# Patient Record
Sex: Male | Born: 1985 | Race: Black or African American | Hispanic: No | Marital: Single | Smoking: Former smoker
Health system: Southern US, Community
[De-identification: ages and names within clinical notes are randomized; demographics above are authoritative.]

## PROBLEM LIST (undated history)

## (undated) DIAGNOSIS — W3400XA Accidental discharge from unspecified firearms or gun, initial encounter: Secondary | ICD-10-CM

## (undated) DIAGNOSIS — K4090 Unilateral inguinal hernia, without obstruction or gangrene, not specified as recurrent: Secondary | ICD-10-CM

## (undated) HISTORY — PX: ABDOMINAL SURGERY: SHX537

---
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
## 2007-08-06 ENCOUNTER — Emergency Department (HOSPITAL_COMMUNITY): Admission: EM | Admit: 2007-08-06 | Discharge: 2007-08-07 | Payer: Self-pay | Admitting: General Surgery

## 2007-10-07 ENCOUNTER — Emergency Department (HOSPITAL_COMMUNITY): Admission: EM | Admit: 2007-10-07 | Discharge: 2007-10-07 | Payer: Self-pay

## 2007-10-10 ENCOUNTER — Emergency Department (HOSPITAL_COMMUNITY): Admission: EM | Admit: 2007-10-10 | Discharge: 2007-10-10 | Payer: Self-pay | Admitting: Emergency Medicine

## 2008-01-14 ENCOUNTER — Emergency Department (HOSPITAL_COMMUNITY): Admission: EM | Admit: 2008-01-14 | Discharge: 2008-01-14 | Payer: Self-pay | Admitting: Family Medicine

## 2008-05-09 ENCOUNTER — Emergency Department (HOSPITAL_COMMUNITY): Admission: EM | Admit: 2008-05-09 | Discharge: 2008-05-09 | Payer: Self-pay | Admitting: Family Medicine

## 2008-06-29 ENCOUNTER — Inpatient Hospital Stay (HOSPITAL_COMMUNITY): Admission: EM | Admit: 2008-06-29 | Discharge: 2008-06-29 | Payer: Self-pay

## 2008-07-31 ENCOUNTER — Emergency Department (HOSPITAL_COMMUNITY): Admission: EM | Admit: 2008-07-31 | Discharge: 2008-07-31 | Payer: Self-pay | Admitting: Emergency Medicine

## 2008-12-18 ENCOUNTER — Emergency Department (HOSPITAL_COMMUNITY): Admission: EM | Admit: 2008-12-18 | Discharge: 2008-12-18 | Payer: Self-pay | Admitting: Emergency Medicine

## 2010-10-05 ENCOUNTER — Inpatient Hospital Stay (INDEPENDENT_AMBULATORY_CARE_PROVIDER_SITE_OTHER)
Admission: RE | Admit: 2010-10-05 | Discharge: 2010-10-05 | Disposition: A | Discharge status: Home / Self Care | Payer: Self-pay | Source: Ambulatory Visit | Attending: Family Medicine | Admitting: Family Medicine

## 2010-10-05 DIAGNOSIS — K4090 Unilateral inguinal hernia, without obstruction or gangrene, not specified as recurrent: Secondary | ICD-10-CM

## 2010-10-05 DIAGNOSIS — K047 Periapical abscess without sinus: Secondary | ICD-10-CM

## 2010-11-10 LAB — GC/CHLAMYDIA PROBE AMP, GENITAL
Chlamydia, DNA Probe: NEGATIVE
GC Probe Amp, Genital: NEGATIVE
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
## 2011-04-26 LAB — DIFFERENTIAL
Basophils Absolute: 0
Basophils Relative: 0
Eosinophils Absolute: 0
Eosinophils Relative: 1
Lymphocytes Relative: 23
Lymphs Abs: 1.4
Monocytes Absolute: 0.3
Monocytes Relative: 5
Neutro Abs: 4.5
Neutrophils Relative %: 71

## 2011-04-26 LAB — CBC
HCT: 45.5
Hemoglobin: 15.7
MCHC: 34.6
MCV: 93
Platelets: 239
RBC: 4.9
RDW: 12.5
WBC: 6.3

## 2011-04-26 LAB — I-STAT 8, (EC8 V) (CONVERTED LAB)
Acid-base deficit: 5 — ABNORMAL HIGH
BUN: 10
Bicarbonate: 18.5 — ABNORMAL LOW
Chloride: 108
Glucose, Bld: 93
HCT: 48
Hemoglobin: 16.3
Operator id: 133351
Potassium: 4.1
Sodium: 140
TCO2: 19
pCO2, Ven: 31.3 — ABNORMAL LOW
pH, Ven: 7.379 — ABNORMAL HIGH

## 2011-04-26 LAB — POCT I-STAT CREATININE
Creatinine, Ser: 1.5
Operator id: 133351

## 2011-04-26 LAB — SAMPLE TO BLOOD BANK

## 2011-04-26 LAB — PROTIME-INR
INR: 1
Prothrombin Time: 13.6

## 2011-04-26 LAB — APTT: aPTT: 25

## 2011-04-26 LAB — ETHANOL: Alcohol, Ethyl (B): 5

## 2011-05-03 LAB — GC/CHLAMYDIA PROBE AMP, GENITAL
Chlamydia, DNA Probe: NEGATIVE
GC Probe Amp, Genital: POSITIVE — AB

## 2011-05-04 LAB — BASIC METABOLIC PANEL
BUN: 11
CO2: 17 — ABNORMAL LOW
Calcium: 9.4
Chloride: 110
Creatinine, Ser: 1.3
GFR calc Af Amer: >60
GFR calc non Af Amer: >60
Glucose, Bld: 103 — ABNORMAL HIGH
Potassium: 3.1 — ABNORMAL LOW
Sodium: 143

## 2011-05-04 LAB — CBC
HCT: 48.3
Hemoglobin: 16.3
MCHC: 33.8
MCV: 94.9
Platelets: 237
RBC: 5.09
RDW: 13.2
WBC: 5.3

## 2011-05-04 LAB — POCT I-STAT, CHEM 8
BUN: 11
Calcium, Ion: 1.06 — ABNORMAL LOW
Creatinine, Ser: 1.4
Sodium: 145
TCO2: 16

## 2011-05-04 LAB — PROTIME-INR
INR: 1
Prothrombin Time: 13.4

## 2011-05-04 LAB — TYPE AND SCREEN
ABO/RH(D): A POS
Antibody Screen: NEGATIVE

## 2012-09-01 ENCOUNTER — Emergency Department (HOSPITAL_COMMUNITY)
Admission: EM | Admit: 2012-09-01 | Discharge: 2012-09-01 | Disposition: A | Discharge status: Home / Self Care | Payer: Self-pay | Attending: Emergency Medicine | Admitting: Emergency Medicine

## 2012-09-01 ENCOUNTER — Encounter (HOSPITAL_COMMUNITY): Payer: Self-pay | Admitting: Cardiology

## 2012-09-01 DIAGNOSIS — K649 Unspecified hemorrhoids: Secondary | ICD-10-CM | POA: Insufficient documentation

## 2012-09-01 DIAGNOSIS — R1011 Right upper quadrant pain: Secondary | ICD-10-CM | POA: Insufficient documentation

## 2012-09-01 DIAGNOSIS — K625 Hemorrhage of anus and rectum: Secondary | ICD-10-CM | POA: Insufficient documentation

## 2012-09-01 LAB — CBC
HCT: 42.9 % (ref 39.0–52.0)
MCHC: 35.2 g/dL (ref 30.0–36.0)
RBC: 4.71 MIL/uL (ref 4.22–5.81)
RDW: 12.5 % (ref 11.5–15.5)

## 2012-09-01 LAB — BASIC METABOLIC PANEL
BUN: 9 mg/dL (ref 6–23)
Chloride: 100 mEq/L (ref 96–112)
GFR calc Af Amer: >90 mL/min (ref >90)
GFR calc non Af Amer: >90 mL/min (ref >90)
Glucose, Bld: 93 mg/dL (ref 70–99)
Potassium: 3.8 mEq/L (ref 3.5–5.1)
Sodium: 136 mEq/L (ref 135–145)

## 2012-09-01 LAB — HEPATIC FUNCTION PANEL
ALT: 75 U/L — ABNORMAL HIGH (ref 0–53)
AST: 29 U/L (ref 0–37)
Albumin: 4.2 g/dL (ref 3.5–5.2)
Bilirubin, Direct: <0.1 mg/dL (ref 0.0–0.3)

## 2012-09-01 MED ORDER — PE-SHARK LIVER OIL-COCOA BUTTR 0.25-3-85.5 % RE SUPP: 1.0000 | RECTAL | Status: DC | PRN

## 2012-09-01 MED ORDER — ONDANSETRON HCL 4 MG/2ML IJ SOLN
4.0000 mg | Freq: Once | INTRAMUSCULAR | Status: DC
  Filled 2012-09-01: qty 2

## 2012-09-01 MED ORDER — MORPHINE SULFATE 4 MG/ML IJ SOLN
4.0000 mg | Freq: Once | INTRAMUSCULAR | Status: DC
  Filled 2012-09-01: qty 1

## 2012-09-01 NOTE — ED Provider Notes (Signed)
History     CSN: [id number]  Arrival date & time 09/01/12  1704   First MD Initiated Contact with Patient 09/01/12 1837      Chief Complaint  Patient presents with  . Abdominal Pain  . Rectal Bleeding    (Consider location/radiation/quality/duration/timing/severity/associated sxs/prior treatment) HPI Pt presenting with c/o pain with defecation and rectal bleeding.  He states he had similar symptoms approx 3 months ago which resolved and then has recurred over the past several weeks.  He does have some lower abdominal cramping as well.  No fever/chills, no vomiting.  No diarrhea.  He does have blood on toilet tissue after passing stools.  No blood mixed in with stool.  There are no other associated systemic symptoms, there are no other alleviating or modifying factors.   History reviewed. No pertinent past medical history.  History reviewed. No pertinent past surgical history.  History reviewed. No pertinent family history.  History  Substance Use Topics  . Smoking status: Not on file  . Smokeless tobacco: Not on file  . Alcohol Use: Yes      Review of Systems ROS reviewed and all otherwise negative except for mentioned in HPI  Allergies  Shellfish allergy  Home Medications   Current Outpatient Rx  Name  Route  Sig  Dispense  Refill  . NAPROXEN SODIUM 220 MG PO TABS   Oral   Take 440 mg by mouth 2 (two) times daily as needed. For pain         . PE-SHARK LIVER OIL-COCOA BUTTR 0.25-3-85.5 % RE SUPP   Rectal   Place 1 suppository rectally as needed for hemorrhoids.   12 suppository   0     BP 105/73  Pulse 52  Temp 98.3 F (36.8 C) (Oral)  Resp 18  SpO2 100% Vitals reviewed Physical Exam Physical Examination: General appearance - alert, well appearing, and in no distress Mental status - alert, oriented to person, place, and time Eyes - pupils equal and reactive, extraocular eye movements intact Mouth - mucous membranes moist, pharynx normal without  lesions Chest - clear to auscultation, no wheezes, rales or rhonchi, symmetric air entry Heart - normal rate, regular rhythm, normal S1, S2, no murmurs, rubs, clicks or gallops Abdomen - soft, nontender, nondistended, no masses or organomegaly Rectal - no fissure, small pea sized hemorrhoid at superior margin of rectum- tender to palpation- does not appear thrombosed, otherwise no mass, no perirectal abscess Extremities - peripheral pulses normal, no pedal edema, no clubbing or cyanosis Skin - normal coloration and turgor, no rashes  ED Course  Procedures (including critical care time)  Labs Reviewed  HEPATIC FUNCTION PANEL - Abnormal; Notable for the following:    ALT 75 (*)     All other components within normal limits  CBC  BASIC METABOLIC PANEL  LAB REPORT - SCANNED  OCCULT BLOOD X 1 CARD TO LAB, STOOL   No results found.   1. Rectal bleeding   2. Hemorrhoid       MDM  Pt pesenting with rectal bleeding, abdominal exam is nontender and benign.  Small hemorrhoid on rectal exam with hemocult positive- no gross blood on DRE.  Labs reassuring without elevation in WBC or anemia.  Pt given rx for preparation H, advised if symptoms continue to contact GI for followup.  Discharged with strict return precautions.  Pt agreeable with plan.        Ethelda Chick, MD 09/02/12 [phone]

## 2012-09-01 NOTE — ED Notes (Signed)
Pt reports back in October he had some rectal bleeding but was never evaluated for symptoms. States recently the bleeding has returned and is now having abd cramping. Reports blood is dark red. States he eats a lot of hot sauce and thinks that may have made it worse.

## 2012-09-01 NOTE — Discharge Instructions (Signed)
Return to the ED with any concerns including vomiting and not able to keep down liquids, fever/chills, worsening abdominal pain- especially if it localizes to the right lower abdomen, decreased level of alertness/lethargy, or any other alarming symptoms °

## 2012-09-01 NOTE — ED Notes (Addendum)
Pt has hx of hernia to left side of abdomen, pt denies having abdominal surgery in the past. Hernia is usually not painful and now he presents with left sided tenderness and right upper quadrant tenderness. Pt has difficulty standing and laying certain ways because of abdominal cramping. Pt states he has had blood in stool "on and off" since October 2012 but it has increased in the past 2 weeks. Pt reports dark red clots in stool. Denies emesis.

## 2013-02-26 ENCOUNTER — Emergency Department (HOSPITAL_COMMUNITY)
Admission: EM | Admit: 2013-02-26 | Discharge: 2013-02-26 | Disposition: A | Discharge status: Home / Self Care | Payer: Self-pay | Attending: General Surgery | Admitting: General Surgery

## 2013-02-26 ENCOUNTER — Encounter (HOSPITAL_COMMUNITY): Payer: Self-pay | Admitting: Cardiology

## 2013-02-26 ENCOUNTER — Emergency Department (HOSPITAL_COMMUNITY): Payer: Self-pay

## 2013-02-26 DIAGNOSIS — Z79899 Other long term (current) drug therapy: Secondary | ICD-10-CM | POA: Insufficient documentation

## 2013-02-26 DIAGNOSIS — K4090 Unilateral inguinal hernia, without obstruction or gangrene, not specified as recurrent: Secondary | ICD-10-CM

## 2013-02-26 DIAGNOSIS — Z87828 Personal history of other (healed) physical injury and trauma: Secondary | ICD-10-CM

## 2013-02-26 LAB — URINALYSIS, ROUTINE W REFLEX MICROSCOPIC
Bilirubin Urine: NEGATIVE
Leukocytes, UA: NEGATIVE
Nitrite: NEGATIVE
Specific Gravity, Urine: 1.017 (ref 1.005–1.030)
Urobilinogen, UA: 0.2 mg/dL (ref 0.0–1.0)

## 2013-02-26 LAB — CBC WITH DIFFERENTIAL/PLATELET
Basophils Relative: 0 % (ref 0–1)
Eosinophils Absolute: 0.1 10*3/uL (ref 0.0–0.7)
Eosinophils Relative: 1 % (ref 0–5)
HCT: 42.4 % (ref 39.0–52.0)
Hemoglobin: 15.3 g/dL (ref 13.0–17.0)
Lymphs Abs: 2 10*3/uL (ref 0.7–4.0)
MCH: 32.2 pg (ref 26.0–34.0)
MCHC: 36.1 g/dL — ABNORMAL HIGH (ref 30.0–36.0)
MCV: 89.3 fL (ref 78.0–100.0)
Monocytes Absolute: 0.8 10*3/uL (ref 0.1–1.0)
Monocytes Relative: 12 % (ref 3–12)
RBC: 4.75 MIL/uL (ref 4.22–5.81)

## 2013-02-26 LAB — COMPREHENSIVE METABOLIC PANEL
Albumin: 4.2 g/dL (ref 3.5–5.2)
Alkaline Phosphatase: 107 U/L (ref 39–117)
BUN: 9 mg/dL (ref 6–23)
Calcium: 9.6 mg/dL (ref 8.4–10.5)
Creatinine, Ser: 0.95 mg/dL (ref 0.50–1.35)
GFR calc Af Amer: >90 mL/min (ref >90)
Glucose, Bld: 108 mg/dL — ABNORMAL HIGH (ref 70–99)
Potassium: 3.7 mEq/L (ref 3.5–5.1)
Total Protein: 7.2 g/dL (ref 6.0–8.3)

## 2013-02-26 LAB — LIPASE, BLOOD: Lipase: 41 U/L (ref 11–59)

## 2013-02-26 LAB — URINE MICROSCOPIC-ADD ON

## 2013-02-26 MED ORDER — ONDANSETRON HCL 4 MG/2ML IJ SOLN
4.0000 mg | Freq: Once | INTRAMUSCULAR | Status: AC
  Administered 2013-02-26: 4 mg via INTRAVENOUS
  Filled 2013-02-26: qty 2

## 2013-02-26 MED ORDER — KETOROLAC TROMETHAMINE 30 MG/ML IJ SOLN
30.0000 mg | Freq: Once | INTRAMUSCULAR | Status: AC
  Administered 2013-02-26: 30 mg via INTRAVENOUS
  Filled 2013-02-26: qty 1

## 2013-02-26 MED ORDER — TRAMADOL HCL 50 MG PO TABS: 50.0000 mg | ORAL_TABLET | Freq: Four times a day (QID) | ORAL | Status: DC | PRN

## 2013-02-26 MED ORDER — HYDROMORPHONE HCL PF 1 MG/ML IJ SOLN
0.5000 mg | Freq: Once | INTRAMUSCULAR | Status: AC
  Administered 2013-02-26: 0.5 mg via INTRAVENOUS
  Filled 2013-02-26: qty 1

## 2013-02-26 MED ORDER — IOHEXOL 300 MG/ML  SOLN
25.0000 mL | INTRAMUSCULAR | Status: AC
  Administered 2013-02-26 (×2): 25 mL via ORAL

## 2013-02-26 MED ORDER — SODIUM CHLORIDE 0.9 % IV SOLN
1000.0000 mL | Freq: Once | INTRAVENOUS | Status: AC
  Administered 2013-02-26: 1000 mL via INTRAVENOUS
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
## 2013-02-26 NOTE — ED Notes (Signed)
Pt reports a left sided hernia that he was dx with about 13 years. States that he has been seen for this before and given referrals but unable to afford the surgery right now. Denies any changes in bowel movements. Denies any vomiting but does report nausea.

## 2013-02-26 NOTE — ED Notes (Signed)
Patient transported to CT 

## 2013-02-26 NOTE — ED Provider Notes (Signed)
CSN: [id number]     Arrival date & time 02/26/13  1005 History     First MD Initiated Contact with Patient 02/26/13 1113     Chief Complaint  Patient presents with  . Hernia   (Consider location/radiation/quality/duration/timing/severity/associated sxs/prior Treatment) HPI Patient presents with concerns of increasing pain in his left lower abdomen and scrotum. He has previously been diagnosed with hernia, but has no prior surgical repair. He states that in particular over the past few days pain has become severe, incapacitating. The pain is sore, worse with motion.  Pain is minimally improved with scrotal elevation. No concurrent fever, chills.  She does have nausea with diarrhea, but no vomiting. Patient is otherwise well and has no other medical problems.  History reviewed. No pertinent past medical history. History reviewed. No pertinent past surgical history. History reviewed. No pertinent family history. History  Substance Use Topics  . Smoking status: Not on file  . Smokeless tobacco: Not on file  . Alcohol Use: No    Review of Systems  Constitutional:       Per HPI, otherwise negative  HENT:       Per HPI, otherwise negative  Respiratory:       Per HPI, otherwise negative  Cardiovascular:       Per HPI, otherwise negative  Gastrointestinal: Positive for nausea and diarrhea. Negative for vomiting.  Endocrine:       Negative aside from HPI  Genitourinary:       Neg aside from HPI   Musculoskeletal:       Per HPI, otherwise negative  Skin: Negative.   Neurological: Negative for syncope.    Allergies  Shellfish allergy  Home Medications   Current Outpatient Rx  Name  Route  Sig  Dispense  Refill  . naproxen sodium (ANAPROX) 220 MG tablet   Oral   Take 440 mg by mouth 2 (two) times daily as needed. For pain         . shark liver oil-cocoa butter (PREPARATION H) 0.25-3-85.5 % suppository   Rectal   Place 1 suppository rectally as needed for  hemorrhoids.   12 suppository   0    BP 120/73  Pulse 66  Temp(Src) 98.6 F (37 C) (Oral)  Resp 17  SpO2 100% Physical Exam  Nursing note and vitals reviewed. Constitutional: He is oriented to person, place, and time. He appears well-developed. No distress.  HENT:  Head: Normocephalic and atraumatic.  Eyes: Conjunctivae and EOM are normal.  Cardiovascular: Normal rate and regular rhythm.   Pulmonary/Chest: Effort normal. No stridor. No respiratory distress.  Abdominal: He exhibits no distension. There is tenderness in the left lower quadrant. There is no rigidity, no rebound, no guarding and no CVA tenderness.  Genitourinary:  There is gross edema of the scrotal sac with palpable mass on the left greater than right.  The entire scrotum, and left inguinal canal are exquisitely tender to palpation.   Musculoskeletal: He exhibits no edema.  Neurological: He is alert and oriented to person, place, and time.  Skin: Skin is warm and dry.  Psychiatric: He has a normal mood and affect.    ED Course   Procedures (including critical care time)  Labs Reviewed  CBC WITH DIFFERENTIAL  COMPREHENSIVE METABOLIC PANEL  LIPASE, BLOOD  URINALYSIS, ROUTINE W REFLEX MICROSCOPIC   No results found. No diagnosis found. Pulse ox 99% room air normal  I interpreted the CT findings and discussed results with surgery.  Update:  Patient appears more comfortable. MDM  Patient presents with worsening pain about a left inguinal hernia.  On exam he is awake alert, afebrile.  The patient's description of more severe pain prompted CT evaluation with labs.  There is a large inguinal hernia, but no incarceration of bowel.  With the hernia, discussed his case with surgery who saw and evaluated the patient.  Patient will have outpatient followup.  Gerhard Munch, MD 02/26/13 [phone]

## 2013-02-27 MED ORDER — TRAMADOL HCL 50 MG PO TABS: 50.0000 mg | ORAL_TABLET | Freq: Four times a day (QID) | ORAL | Status: DC | PRN

## 2013-02-27 NOTE — Consult Note (Signed)
Agree Tej Murdaugh, MD, MPH, FACS Pager: [phone]  

## 2014-04-16 ENCOUNTER — Emergency Department (HOSPITAL_COMMUNITY): Payer: Self-pay

## 2014-04-16 ENCOUNTER — Encounter (HOSPITAL_COMMUNITY): Payer: Self-pay | Admitting: Emergency Medicine

## 2014-04-16 ENCOUNTER — Emergency Department (HOSPITAL_COMMUNITY)
Admission: EM | Admit: 2014-04-16 | Discharge: 2014-04-17 | Disposition: A | Discharge status: Home / Self Care | Payer: Self-pay | Attending: Emergency Medicine | Admitting: Emergency Medicine

## 2014-04-16 DIAGNOSIS — R1032 Left lower quadrant pain: Secondary | ICD-10-CM | POA: Insufficient documentation

## 2014-04-16 DIAGNOSIS — F17200 Nicotine dependence, unspecified, uncomplicated: Secondary | ICD-10-CM | POA: Insufficient documentation

## 2014-04-16 DIAGNOSIS — K4090 Unilateral inguinal hernia, without obstruction or gangrene, not specified as recurrent: Secondary | ICD-10-CM | POA: Insufficient documentation

## 2014-04-16 HISTORY — DX: Unilateral inguinal hernia, without obstruction or gangrene, not specified as recurrent: K40.90

## 2014-04-16 LAB — CBC WITH DIFFERENTIAL/PLATELET
BASOS ABS: 0 10*3/uL (ref 0.0–0.1)
BASOS PCT: 0 % (ref 0–1)
EOS PCT: 1 % (ref 0–5)
Eosinophils Absolute: 0 10*3/uL (ref 0.0–0.7)
HCT: 46.3 % (ref 39.0–52.0)
Hemoglobin: 16.5 g/dL (ref 13.0–17.0)
LYMPHS ABS: 2.2 10*3/uL (ref 0.7–4.0)
Lymphocytes Relative: 51 % — ABNORMAL HIGH (ref 12–46)
MCH: 31.5 pg (ref 26.0–34.0)
MCHC: 35.6 g/dL (ref 30.0–36.0)
MCV: 88.5 fL (ref 78.0–100.0)
Monocytes Absolute: 0.3 10*3/uL (ref 0.1–1.0)
Monocytes Relative: 7 % (ref 3–12)
NEUTROS PCT: 41 % — AB (ref 43–77)
Neutro Abs: 1.8 10*3/uL (ref 1.7–7.7)
PLATELETS: 217 10*3/uL (ref 150–400)
RBC: 5.23 MIL/uL (ref 4.22–5.81)
RDW: 12.5 % (ref 11.5–15.5)
WBC: 4.3 10*3/uL (ref 4.0–10.5)

## 2014-04-16 LAB — BASIC METABOLIC PANEL
ANION GAP: 15 (ref 5–15)
BUN: 7 mg/dL (ref 6–23)
CALCIUM: 9.5 mg/dL (ref 8.4–10.5)
CO2: 25 mEq/L (ref 19–32)
Chloride: 104 mEq/L (ref 96–112)
Creatinine, Ser: 1.01 mg/dL (ref 0.50–1.35)
Glucose, Bld: 110 mg/dL — ABNORMAL HIGH (ref 70–99)
POTASSIUM: 4 meq/L (ref 3.7–5.3)
SODIUM: 144 meq/L (ref 137–147)

## 2014-04-16 MED ORDER — IOHEXOL 300 MG/ML  SOLN
25.0000 mL | INTRAMUSCULAR | Status: AC
  Administered 2014-04-16 (×2): 25 mL via ORAL

## 2014-04-16 NOTE — ED Notes (Signed)
Pt c/o increased pain to his hernia, sts he has had it since he was 28 years old. sts he has been seen for this before and given pain meds and told to follow up, pt sts he hasn't followed up with anyone yet. Nad, skin warm and dry, resp e/u.

## 2014-04-16 NOTE — ED Notes (Signed)
Dr. Rancour at bedside. 

## 2014-04-16 NOTE — ED Provider Notes (Signed)
CSN: [id number]     Arrival date & time 04/16/14  1622 History   First MD Initiated Contact with Patient 04/16/14 2018     Chief Complaint  Patient presents with  . Inguinal Hernia     (Consider location/radiation/quality/duration/timing/severity/associated sxs/prior Treatment) The history is provided by the patient and medical records. No language interpreter was used.    Vincent Baldwin is a 28 y.o. male  with a hx of hernia presents to the Emergency Department complaining of gradual, persistent, progressively worsening pain in the left scrotum and at the site of the hernia onset 3 days ago after lifting heavy boxes at work.  Patient reports he developed a hernia approximately age 45 but has not followed up with primary care physician or general surgery for this. He reports that 3 years ago it grew to approximately the size that it is now, but he feels like it is some bigger since Sunday. He reports is looking uncomfortable but the pain has worsened in the last several days since he lifted a box. Patient reports taking Vicodin at home which alleviates the pain but pain returns when the medication wears off. Patient reports that he is unable to reduce the hernia at baseline. No aggravating or alleviating factors. Patient denies fever, chills, nausea, vomiting. No difficulty with bowel movements.  Past Medical History  Diagnosis Date  . Inguinal hernia    History reviewed. No pertinent past surgical history. No family history on file. History  Substance Use Topics  . Smoking status: Current Every Day Smoker -- 0.25 packs/day  . Smokeless tobacco: Not on file  . Alcohol Use: Yes     Comment: socially    Review of Systems  Constitutional: Negative for fever, diaphoresis, appetite change, fatigue and unexpected weight change.  HENT: Negative for mouth sores.   Eyes: Negative for visual disturbance.  Respiratory: Negative for cough, chest tightness, shortness of breath and wheezing.    Cardiovascular: Negative for chest pain.  Gastrointestinal: Positive for abdominal pain. Negative for nausea, vomiting, diarrhea and constipation.  Endocrine: Negative for polydipsia, polyphagia and polyuria.  Genitourinary: Positive for scrotal swelling. Negative for dysuria, urgency, frequency and hematuria.       Inguinal hernia  Musculoskeletal: Negative for back pain and neck stiffness.  Skin: Negative for rash.  Allergic/Immunologic: Negative for immunocompromised state.  Neurological: Negative for syncope, light-headedness and headaches.  Hematological: Does not bruise/bleed easily.  Psychiatric/Behavioral: Negative for sleep disturbance. The patient is not nervous/anxious.       Allergies  Shellfish allergy  Home Medications   Prior to Admission medications   Medication Sig Start Date End Date Taking? Authorizing Provider  acetaminophen (TYLENOL) 325 MG tablet Take 650 mg by mouth every 6 (six) hours as needed for mild pain.   Yes Historical Provider, MD  ketorolac (TORADOL) 10 MG tablet Take 1 tablet (10 mg total) by mouth every 6 (six) hours as needed. 04/17/14   Sanora Cunanan, PA-C   BP 108/76  Pulse 52  Temp(Src) 99.5 F (37.5 C) (Oral)  Resp 16  Ht  (1.702 m)  Wt 194 lb (87.998 kg)  BMI 30.38 kg/m2  SpO2 100% Physical Exam  Nursing note and vitals reviewed. Constitutional: He appears well-developed and well-nourished. No distress.  Awake, alert, nontoxic appearance  HENT:  Head: Normocephalic and atraumatic.  Mouth/Throat: Oropharynx is clear and moist. No oropharyngeal exudate.  Eyes: Conjunctivae are normal. No scleral icterus.  Neck: Normal range of motion. Neck supple.  Cardiovascular:  Normal rate, regular rhythm, normal heart sounds and intact distal pulses.   No murmur heard. Pulmonary/Chest: Effort normal and breath sounds normal. No respiratory distress. He has no wheezes.  Equal chest expansion  Abdominal: Soft. Bowel sounds are normal.  He exhibits no mass. There is tenderness in the left lower quadrant. There is no rebound and no guarding. A hernia is present. Hernia confirmed positive in the left inguinal area. Hernia confirmed negative in the right inguinal area.  Very mild left lower quadrant abdominal pain which creates pain in the inguinal ring with palpation  Genitourinary: Testes normal and penis normal. Right testis shows no mass, no swelling and no tenderness. Right testis is descended. Left testis shows no mass, no swelling and no tenderness. Left testis is descended. Circumcised. No phimosis, paraphimosis, hypospadias, penile erythema or penile tenderness. No discharge found.  Palpation of the left inguinal ring Large hernia present in the scrotum; not reducible No pain to palpation of right or left testicle, no pain to palpation of the penis  Musculoskeletal: Normal range of motion. He exhibits no edema.  Lymphadenopathy:       Right: No inguinal adenopathy present.       Left: No inguinal adenopathy present.  Neurological: He is alert.  Speech is clear and goal oriented Moves extremities without ataxia  Skin: Skin is warm and dry. He is not diaphoretic.  Psychiatric: He has a normal mood and affect.    ED Course  Procedures (including critical care time) Labs Review Labs Reviewed  CBC WITH DIFFERENTIAL - Abnormal; Notable for the following:    Neutrophils Relative % 41 (*)    Lymphocytes Relative 51 (*)    All other components within normal limits  BASIC METABOLIC PANEL - Abnormal; Notable for the following:    Glucose, Bld 110 (*)    All other components within normal limits    Imaging Review Ct Pelvis Wo Contrast  04/17/2014   CLINICAL DATA:  Known hernia since 10 years ago. Pain. Difficult to sit down. Evaluate for bowel strangulation.  EXAM: CT PELVIS WITHOUT CONTRAST  TECHNIQUE: Multidetector CT imaging of the pelvis was performed following the standard protocol without intravenous contrast.   COMPARISON:  Scrotal ultrasound 04/16/2014  FINDINGS: There is a large left inguinal hernia. Herniation of mesenteric fat into the left scrotal sac is noted. However, no herniated bowel loops are identified at the time of the exam. There is no evidence for bowel obstruction. No free pelvic fluid. No pelvic adenopathy. Incidentally noted the appendix has a normal appearance urinary bladder is distended. Prostate gland is normal in appearance.  IMPRESSION: Herniation of a large amount of mesenteric fat into the left scrotal sac. No herniated bowel loops at this time.   Electronically Signed   By: Rosalie Gums M.D.   On: 04/17/2014 00:43   US Scrotum  04/16/2014   CLINICAL DATA:  History of left groin hernia.  CT abdomen pelvis without contrast dated 02/26/2013 describes a large direct inguinal hernia on the left with extension into the left scrotal sac. At that time, the hernia contained only fat. Patient previously evaluated by surgery in July 2014 and recommended for outpatient followup for elective surgery.  EXAM: SCROTAL ULTRASOUND  DOPPLER ULTRASOUND OF THE TESTICLES  TECHNIQUE: Complete ultrasound examination of the testicles, epididymis, and other scrotal structures was performed. Color and spectral Doppler ultrasound were also utilized to evaluate blood flow to the testicles.  COMPARISON:  CT abdomen pelvis 02/26/2013  FINDINGS: Right  testicle  Measurements: 4.2 x 2.4 x 2.7 cm. No mass or microlithiasis visualized.  Left testicle  Measurements: 3.6 x 2.2 x 3.1 cm. No mass or microlithiasis visualized.  Right epididymis:  Normal in size and appearance.  Left epididymis:  Normal in size and appearance.  Hydrocele:  None visualized.  Varicocele:  None visualized.  Additional: In the left inguinal region, prominent fat density is seen lateral to the left testis. This correlates with the large fat-containing inguinal hernia seen on prior CT. On image number 73 the depth of this fat containing hernia measures at  least 6.7 cm. There are some small tubular structures within the hernia that likely reflect mesenteries vessels. No definite bowel signature is seen by ultrasound with in the left inguinal region.  Pulsed Doppler interrogation of both testes demonstrates low resistance arterial and venous waveforms bilaterally.  IMPRESSION: 1. Normal testes bilaterally. 2. Findings consistent with a large left inguinal hernia containing fat and mesenteric vessels as described on prior CT of 02/26/2013. No definite bowel loops are seen within the hernia sac on today's ultrasound.   Electronically Signed   By: Britta Mccreedy M.D.   On: 04/16/2014 22:59   [place] Art/ven Flow Abd Pelv Doppler  04/16/2014   CLINICAL DATA:  History of left groin hernia.  CT abdomen pelvis without contrast dated 02/26/2013 describes a large direct inguinal hernia on the left with extension into the left scrotal sac. At that time, the hernia contained only fat. Patient previously evaluated by surgery in July 2014 and recommended for outpatient followup for elective surgery.  EXAM: SCROTAL ULTRASOUND  DOPPLER ULTRASOUND OF THE TESTICLES  TECHNIQUE: Complete ultrasound examination of the testicles, epididymis, and other scrotal structures was performed. Color and spectral Doppler ultrasound were also utilized to evaluate blood flow to the testicles.  COMPARISON:  CT abdomen pelvis 02/26/2013  FINDINGS: Right testicle  Measurements: 4.2 x 2.4 x 2.7 cm. No mass or microlithiasis visualized.  Left testicle  Measurements: 3.6 x 2.2 x 3.1 cm. No mass or microlithiasis visualized.  Right epididymis:  Normal in size and appearance.  Left epididymis:  Normal in size and appearance.  Hydrocele:  None visualized.  Varicocele:  None visualized.  Additional: In the left inguinal region, prominent fat density is seen lateral to the left testis. This correlates with the large fat-containing inguinal hernia seen on prior CT. On image number 73 the depth of this fat containing  hernia measures at least 6.7 cm. There are some small tubular structures within the hernia that likely reflect mesenteries vessels. No definite bowel signature is seen by ultrasound with in the left inguinal region.  Pulsed Doppler interrogation of both testes demonstrates low resistance arterial and venous waveforms bilaterally.  IMPRESSION: 1. Normal testes bilaterally. 2. Findings consistent with a large left inguinal hernia containing fat and mesenteric vessels as described on prior CT of 02/26/2013. No definite bowel loops are seen within the hernia sac on today's ultrasound.   Electronically Signed   By: Britta Mccreedy M.D.   On: 04/16/2014 22:59     EKG Interpretation None      MDM   Final diagnoses:  Left inguinal hernia    Tyrell Antonio resents for worsening pain and increased size to the left inguinal hernia.  Record review shows the patient was evaluated approximately one year ago and hernia was not reducible at that time. Patient was evaluated by Dr. Janee Morn in general surgery and deemed appropriate for outpatient followup. CT scan  at that time showed fat and no loops of bowel within the scrotum.  Discussed with Janee Morn who recommends outpatient follow-up if there is no strangulation of the bowel.  Will obtain imaging to confirm no bowel involvement.    11:21 PM [place] reassuring; CT pending.    The patient was discussed with and seen by Dr. Manus Gunning who agrees with the treatment plan.  12:49 AM CT scan with herniation of a large amount of mesenteric fat into the left scrotal sac no herniated bowel loops. Patient pain control at this time. I [facility].  I have personally reviewed patient's vitals, nursing note and any pertinent labs or imaging.  I performed an undressed physical exam.    It has been determined that no acute conditions requiring further emergency intervention are present at this time. The  patient/guardian have been advised of the diagnosis and plan. I reviewed all labs and imaging including any potential incidental findings. We have discussed signs and symptoms that warrant return to the ED, such as vomiting or difficulty with defecation.  Patient/guardian has voiced understanding and agreed to fo2P or specialist in 2 days.  Vital signs are stable at discharge.   BP 108/76  Pulse 52  Temp(Src) 99.5 F (37.5 C) (Oral)  Resp 16  Ht  (1.702 m)  Wt 194 lb (87.998 kg)  BMI 30.38 kg/m2  SpO2 100%        Dierdre Forth, PA-C 04/17/14 0151

## 2014-04-17 MED ORDER — KETOROLAC TROMETHAMINE 10 MG PO TABS
10.0000 mg | ORAL_TABLET | Freq: Once | ORAL | Status: AC
  Administered 2014-04-17: 10 mg via ORAL
  Filled 2014-04-17: qty 1

## 2014-04-17 MED ORDER — KETOROLAC TROMETHAMINE 10 MG PO TABS: 10.0000 mg | ORAL_TABLET | Freq: Four times a day (QID) | ORAL | Status: DC | PRN

## 2014-04-17 NOTE — ED Provider Notes (Signed)
Medical screening examination/treatment/procedure(s) were conducted as a shared visit with non-physician practitioner(s) and myself.  I personally evaluated the patient during the encounter.  L inguinal hernia x 16 years, worsening pain since lifting box 3 days ago.  No vomiting or diarrhea no fever. Patient state unable to reduce hernia at baseline, has no seen surgery.  CT 1 year ago showed no bowel in hernia. Large L inguinal hernia into scrotum.. No testicular tenderness. Abdomen soft and nontender. No bowel involvement on imaging.   EKG Interpretation None        Glynn Octave, MD 04/17/14 [phone]

## 2014-04-17 NOTE — ED Notes (Signed)
Pt A&OX4, ambulatory at d/c with steady gait, NAD 

## 2014-04-17 NOTE — Discharge Instructions (Signed)
1. Medications: toradol for pain control, usual home medications 2. Treatment: rest, drink plenty of fluids,  3. Follow Up: Please followup with the [facility] and [facility] for repair of your hernia    Inguinal Hernia, Adult Muscles help keep everything in the body in its proper place. But if a weak spot in the muscles develops, something can poke through. That is called a hernia. When this happens in the lower part of the belly (abdomen), it is called an inguinal hernia. (It takes its name from a part of the body in this region called the inguinal canal.) A weak spot in the wall of muscles lets some fat or part of the small intestine bulge through. An inguinal hernia can develop at any age. Men get them more often than women. CAUSES  In adults, an inguinal hernia develops over time.  It can be triggered by:  Suddenly straining the muscles of the lower abdomen.  Lifting heavy objects.  Straining to have a bowel movement. Difficult bowel movements (constipation) can lead to this.  Constant coughing. This may be caused by smoking or lung disease.  Being overweight.  Being pregnant.  Working at a job that requires long periods of standing or heavy lifting.  Having had an inguinal hernia before. One type can be an emergency situation. It is called a strangulated inguinal hernia. It develops if part of the small intestine slips through the weak spot and cannot get back into the abdomen. The blood supply can be cut off. If that happens, part of the intestine may die. This situation requires emergency surgery. SYMPTOMS  Often, a small inguinal hernia has no symptoms. It is found when a healthcare provider does a physical exam. Larger hernias usually have symptoms.   In adults, symptoms may include:  A lump in the groin. This is easier to see when the person is standing. It might disappear when lying down.  In men, a lump in the scrotum.  Pain or  burning in the groin. This occurs especially when lifting, straining or coughing.  A dull ache or feeling of pressure in the groin.  Signs of a strangulated hernia can include:  A bulge in the groin that becomes very painful and tender to the touch.  A bulge that turns red or purple.  Fever, nausea and vomiting.  Inability to have a bowel movement or to pass gas. DIAGNOSIS  To decide if you have an inguinal hernia, a healthcare provider will probably do a physical examination.  This will include asking questions about any symptoms you have noticed.  The healthcare provider might feel the groin area and ask you to cough. If an inguinal hernia is felt, the healthcare provider may try to slide it back into the abdomen.  Usually no other tests are needed. TREATMENT  Treatments can vary. The size of the hernia makes a difference. Options include:  Watchful waiting. This is often suggested if the hernia is small and you have had no symptoms.  No medical procedure will be done unless symptoms develop.  You will need to watch closely for symptoms. If any occur, contact your healthcare provider right away.  Surgery. This is used if the hernia is larger or you have symptoms.  Open surgery. This is usually an outpatient procedure (you will not stay overnight in a hospital). An cut (incision) is made through the skin in the groin. The hernia is put back inside the abdomen. The weak  area in the muscles is then repaired by herniorrhaphy or hernioplasty. Herniorrhaphy: in this type of surgery, the weak muscles are sewn back together. Hernioplasty: a patch or mesh is used to close the weak area in the abdominal wall.  Laparoscopy. In this procedure, a surgeon makes small incisions. A thin tube with a tiny video camera (called a laparoscope) is put into the abdomen. The surgeon repairs the hernia with mesh by looking with the video camera and using two long instruments. HOME CARE INSTRUCTIONS    After surgery to repair an inguinal hernia:  You will need to take pain medicine prescribed by your healthcare provider. Follow all directions carefully.  You will need to take care of the wound from the incision.  Your activity will be restricted for awhile. This will probably include no heavy lifting for several weeks. You also should not do anything too active for a few weeks. When you can return to work will depend on the type of job that you have.  During "watchful waiting" periods, you should:  Maintain a healthy weight.  Eat a diet high in fiber (fruits, vegetables and whole grains).  Drink plenty of fluids to avoid constipation. This means drinking enough water and other liquids to keep your urine clear or pale yellow.  Do not lift heavy objects.  Do not stand for long periods of time.  Quit smoking. This should keep you from developing a frequent cough. SEEK MEDICAL CARE IF:   A bulge develops in your groin area.  You feel pain, a burning sensation or pressure in the groin. This might be worse if you are lifting or straining.  You develop a fever of more than 100.5 F (38.1 C). SEEK IMMEDIATE MEDICAL CARE IF:   Pain in the groin increases suddenly.  A bulge in the groin gets bigger suddenly and does not go down.  For men, there is sudden pain in the scrotum. Or, the size of the scrotum increases.  A bulge in the groin area becomes red or purple and is painful to touch.  You have nausea or vomiting that does not go away.  You feel your heart beating much faster than normal.  You cannot have a bowel movement or pass gas.  You develop a fever of more than 102.0 F (38.9 C). Document Released: 12/05/2008 Document Revised: 10/11/2011 Document Reviewed: 12/05/2008 [facility] Patient Information 2015 [place], [place]. This information is not intended to replace advice given to you by your health care provider. Make sure you discuss any questions you have with your  health care provider.

## 2014-04-19 ENCOUNTER — Ambulatory Visit: Payer: Self-pay | Admitting: Family Medicine

## 2014-12-31 ENCOUNTER — Emergency Department (HOSPITAL_COMMUNITY)
Admission: EM | Admit: 2014-12-31 | Discharge: 2014-12-31 | Disposition: A | Discharge status: Home / Self Care | Payer: Self-pay | Attending: Emergency Medicine | Admitting: Emergency Medicine

## 2014-12-31 ENCOUNTER — Encounter (HOSPITAL_COMMUNITY): Payer: Self-pay | Admitting: Emergency Medicine

## 2014-12-31 DIAGNOSIS — Z720 Tobacco use: Secondary | ICD-10-CM | POA: Insufficient documentation

## 2014-12-31 DIAGNOSIS — Y9289 Other specified places as the place of occurrence of the external cause: Secondary | ICD-10-CM | POA: Insufficient documentation

## 2014-12-31 DIAGNOSIS — Y9389 Activity, other specified: Secondary | ICD-10-CM | POA: Insufficient documentation

## 2014-12-31 DIAGNOSIS — Z8719 Personal history of other diseases of the digestive system: Secondary | ICD-10-CM | POA: Insufficient documentation

## 2014-12-31 DIAGNOSIS — Y998 Other external cause status: Secondary | ICD-10-CM | POA: Insufficient documentation

## 2014-12-31 DIAGNOSIS — W01198A Fall on same level from slipping, tripping and stumbling with subsequent striking against other object, initial encounter: Secondary | ICD-10-CM | POA: Insufficient documentation

## 2014-12-31 DIAGNOSIS — S0921XA Traumatic rupture of right ear drum, initial encounter: Secondary | ICD-10-CM | POA: Insufficient documentation

## 2014-12-31 MED ORDER — HYDROCODONE-ACETAMINOPHEN 5-325 MG PO TABS: 1.0000 | ORAL_TABLET | Freq: Four times a day (QID) | ORAL | Status: DC | PRN

## 2014-12-31 MED ORDER — CIPROFLOXACIN-DEXAMETHASONE 0.3-0.1 % OT SUSP
4.0000 [drp] | Freq: Once | OTIC | Status: AC
  Administered 2014-12-31: 4 [drp] via OTIC
  Filled 2014-12-31: qty 7.5

## 2014-12-31 MED ORDER — CIPROFLOXACIN-DEXAMETHASONE 0.3-0.1 % OT SUSP: 4.0000 [drp] | Freq: Two times a day (BID) | OTIC | Status: DC
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
## 2014-12-31 NOTE — Discharge Instructions (Signed)
Eardrum Perforation °The eardrum is a thin, round tissue inside the ear that separates the ear canal from the middle ear. This is the tissue that detects sound and enables you to hear. The eardrum can be punctured or torn (perforated). Eardrums generally heal without help and with little or no permanent hearing loss. °CAUSES  °· Sudden pressure changes that happen in situations like scuba diving or flying in an airplane. °· Foreign objects in the ear. °· Inserting a cotton-tipped swab in the ear. °· Loud noise. °· Trauma to the ear. °SYMPTOMS  °· Hearing loss. °· Ear pain. °· Ringing in the ears. °· Discharge or bleeding from the ear. °· Dizziness. °· Vomiting. °· Facial paralysis. °HOME CARE INSTRUCTIONS  °· Keep your ear dry, as this improves healing. Swimming, diving, and showers are not allowed until healing is complete. While bathing, protect the ear by placing a piece of cotton covered with petroleum jelly in the outer ear canal. °· Only take over-the-counter or prescription medicines for pain, discomfort, or fever as directed by your caregiver. °· Blow your nose gently. Forceful blowing increases the pressure in the middle ear and may cause further injury or delay healing. °· Resume normal activities, such as showering, when the perforation has healed. Your caregiver can let you know when this has occurred. °· Talk to your caregiver before flying on an airplane. Air travel is generally allowed with a perforated eardrum. °· If your caregiver has given you a follow-up appointment, it is very important to keep that appointment. Failure to keep the appointment could result in a chronic or permanent injury, pain, hearing loss, and disability. °SEEK IMMEDIATE MEDICAL CARE IF:  °· You have bleeding or pus coming from your ear. °· You have problems with balance, dizziness, nausea, or vomiting. °· You develop increased pain. °· You have a fever. °MAKE SURE YOU:  °· Understand these instructions. °· Will watch your  condition. °· Will get help right away if you are not doing well or get worse. °Document Released: 07/16/2000 Document Revised: 10/11/2011 Document Reviewed: 07/18/2008 °Exit[employer]® Patient Information ©2015 [employer]. This information is not intended to replace advice given to you by your health care provider. Make sure you discuss any questions you have with your health care provider. ° °

## 2014-12-31 NOTE — ED Notes (Signed)
Pt updated on wait for drops from pharmacy

## 2015-07-29 ENCOUNTER — Encounter (HOSPITAL_COMMUNITY): Payer: Self-pay | Admitting: Emergency Medicine

## 2015-07-29 ENCOUNTER — Emergency Department (HOSPITAL_COMMUNITY): Payer: Self-pay

## 2015-07-29 ENCOUNTER — Observation Stay (HOSPITAL_COMMUNITY): Payer: Self-pay | Admitting: Anesthesiology

## 2015-07-29 ENCOUNTER — Encounter (HOSPITAL_COMMUNITY)
Admission: EM | Disposition: A | Discharge status: Home / Self Care | Payer: Self-pay | Source: Home / Self Care | Attending: Emergency Medicine

## 2015-07-29 ENCOUNTER — Observation Stay (HOSPITAL_COMMUNITY)
Admission: EM | Admit: 2015-07-29 | Discharge: 2015-07-31 | Disposition: A | Discharge status: Home / Self Care | Payer: Self-pay | Attending: General Surgery | Admitting: General Surgery

## 2015-07-29 DIAGNOSIS — K4090 Unilateral inguinal hernia, without obstruction or gangrene, not specified as recurrent: Secondary | ICD-10-CM

## 2015-07-29 DIAGNOSIS — K4030 Unilateral inguinal hernia, with obstruction, without gangrene, not specified as recurrent: Principal | ICD-10-CM | POA: Diagnosis present

## 2015-07-29 DIAGNOSIS — F17200 Nicotine dependence, unspecified, uncomplicated: Secondary | ICD-10-CM | POA: Insufficient documentation

## 2015-07-29 HISTORY — PX: INGUINAL HERNIA REPAIR: SHX194

## 2015-07-29 LAB — URINALYSIS, ROUTINE W REFLEX MICROSCOPIC
Bilirubin Urine: NEGATIVE
GLUCOSE, UA: NEGATIVE mg/dL
KETONES UR: NEGATIVE mg/dL
LEUKOCYTES UA: NEGATIVE
NITRITE: NEGATIVE
PROTEIN: NEGATIVE mg/dL
Specific Gravity, Urine: 1.01 (ref 1.005–1.030)
pH: 6 (ref 5.0–8.0)

## 2015-07-29 LAB — URINE MICROSCOPIC-ADD ON: WBC UA: NONE SEEN WBC/hpf (ref 0–5)

## 2015-07-29 LAB — COMPREHENSIVE METABOLIC PANEL
ALK PHOS: 84 U/L (ref 38–126)
ALT: 22 U/L (ref 17–63)
AST: 21 U/L (ref 15–41)
Albumin: 4.1 g/dL (ref 3.5–5.0)
Anion gap: 9 (ref 5–15)
BUN: 6 mg/dL (ref 6–20)
CALCIUM: 9.2 mg/dL (ref 8.9–10.3)
CO2: 25 mmol/L (ref 22–32)
Chloride: 108 mmol/L (ref 101–111)
Creatinine, Ser: 1 mg/dL (ref 0.61–1.24)
GFR calc Af Amer: >60 mL/min (ref >60)
GFR calc non Af Amer: >60 mL/min (ref >60)
GLUCOSE: 120 mg/dL — AB (ref 65–99)
Potassium: 3.5 mmol/L (ref 3.5–5.1)
SODIUM: 142 mmol/L (ref 135–145)
Total Bilirubin: 0.3 mg/dL (ref 0.3–1.2)
Total Protein: 6.7 g/dL (ref 6.5–8.1)

## 2015-07-29 LAB — CBC WITH DIFFERENTIAL/PLATELET
BASOS PCT: 0 %
Basophils Absolute: 0 10*3/uL (ref 0.0–0.1)
EOS ABS: 0.1 10*3/uL (ref 0.0–0.7)
Eosinophils Relative: 1 %
HCT: 41 % (ref 39.0–52.0)
HEMOGLOBIN: 14.4 g/dL (ref 13.0–17.0)
Lymphocytes Relative: 62 %
Lymphs Abs: 3.2 10*3/uL (ref 0.7–4.0)
MCH: 32 pg (ref 26.0–34.0)
MCHC: 35.1 g/dL (ref 30.0–36.0)
MCV: 91.1 fL (ref 78.0–100.0)
Monocytes Absolute: 0.4 10*3/uL (ref 0.1–1.0)
Monocytes Relative: 8 %
NEUTROS PCT: 29 %
Neutro Abs: 1.5 10*3/uL — ABNORMAL LOW (ref 1.7–7.7)
Platelets: 226 10*3/uL (ref 150–400)
RBC: 4.5 MIL/uL (ref 4.22–5.81)
RDW: 12.8 % (ref 11.5–15.5)
WBC: 5.2 10*3/uL (ref 4.0–10.5)

## 2015-07-29 SURGERY — REPAIR, HERNIA, INGUINAL, ADULT
Anesthesia: General | Site: Groin | Laterality: Left

## 2015-07-29 MED ORDER — MIDAZOLAM HCL 2 MG/2ML IJ SOLN
INTRAMUSCULAR | Status: AC
  Administered 2015-07-29: 2 mg via INTRAVENOUS
  Filled 2015-07-29: qty 2

## 2015-07-29 MED ORDER — LIDOCAINE HCL (CARDIAC) 20 MG/ML IV SOLN
INTRAVENOUS | Status: AC
  Filled 2015-07-29: qty 5

## 2015-07-29 MED ORDER — ONDANSETRON 4 MG PO TBDP: 4.0000 mg | ORAL_TABLET | Freq: Four times a day (QID) | ORAL | Status: DC | PRN

## 2015-07-29 MED ORDER — FENTANYL CITRATE (PF) 100 MCG/2ML IJ SOLN
INTRAMUSCULAR | Status: DC | PRN
  Administered 2015-07-29: 100 ug via INTRAVENOUS
  Administered 2015-07-29 (×3): 50 ug via INTRAVENOUS
  Administered 2015-07-29: 100 ug via INTRAVENOUS

## 2015-07-29 MED ORDER — ONDANSETRON HCL 4 MG/2ML IJ SOLN
4.0000 mg | Freq: Four times a day (QID) | INTRAMUSCULAR | Status: DC | PRN
Start: 2015-07-29 — End: 2015-07-29

## 2015-07-29 MED ORDER — HYDROMORPHONE HCL 1 MG/ML IJ SOLN
1.0000 mg | Freq: Once | INTRAMUSCULAR | Status: AC
  Administered 2015-07-29: 1 mg via INTRAVENOUS
  Filled 2015-07-29: qty 1

## 2015-07-29 MED ORDER — ONDANSETRON HCL 4 MG/2ML IJ SOLN
INTRAMUSCULAR | Status: AC
  Filled 2015-07-29: qty 2

## 2015-07-29 MED ORDER — ACETAMINOPHEN 325 MG PO TABS: 650.0000 mg | ORAL_TABLET | Freq: Four times a day (QID) | ORAL | Status: DC | PRN

## 2015-07-29 MED ORDER — OXYCODONE HCL 5 MG PO TABS
5.0000 mg | ORAL_TABLET | Freq: Four times a day (QID) | ORAL | Status: DC | PRN
  Administered 2015-07-29 – 2015-07-31 (×6): 10 mg via ORAL
  Filled 2015-07-29 (×7): qty 2

## 2015-07-29 MED ORDER — IOHEXOL 300 MG/ML  SOLN: 100.0000 mL | Freq: Once | INTRAMUSCULAR | Status: DC | PRN

## 2015-07-29 MED ORDER — SODIUM CHLORIDE 0.9 % IV SOLN
INTRAVENOUS | Status: DC
  Administered 2015-07-29: 100 mL via INTRAVENOUS
  Administered 2015-07-30: 01:00:00 via INTRAVENOUS

## 2015-07-29 MED ORDER — ONDANSETRON HCL 4 MG/2ML IJ SOLN
4.0000 mg | Freq: Four times a day (QID) | INTRAMUSCULAR | Status: DC | PRN
  Administered 2015-07-29: 4 mg via INTRAVENOUS
  Filled 2015-07-29: qty 2

## 2015-07-29 MED ORDER — MIDAZOLAM HCL 2 MG/2ML IJ SOLN
2.0000 mg | Freq: Once | INTRAMUSCULAR | Status: AC
  Administered 2015-07-29: 2 mg via INTRAVENOUS

## 2015-07-29 MED ORDER — HYDROMORPHONE HCL 1 MG/ML IJ SOLN
INTRAMUSCULAR | Status: AC
  Administered 2015-07-29: 0.5 mg via INTRAVENOUS
  Filled 2015-07-29: qty 1

## 2015-07-29 MED ORDER — OXYCODONE HCL 5 MG/5ML PO SOLN: 5.0000 mg | Freq: Once | ORAL | Status: AC | PRN

## 2015-07-29 MED ORDER — SUGAMMADEX SODIUM 200 MG/2ML IV SOLN
INTRAVENOUS | Status: AC
  Filled 2015-07-29: qty 2

## 2015-07-29 MED ORDER — OXYCODONE HCL 5 MG PO TABS
ORAL_TABLET | ORAL | Status: AC
  Administered 2015-07-29: 5 mg via ORAL
  Filled 2015-07-29: qty 1

## 2015-07-29 MED ORDER — SUGAMMADEX SODIUM 200 MG/2ML IV SOLN
INTRAVENOUS | Status: DC | PRN
  Administered 2015-07-29: 200 mg via INTRAVENOUS

## 2015-07-29 MED ORDER — 0.9 % SODIUM CHLORIDE (POUR BTL) OPTIME
TOPICAL | Status: DC | PRN
  Administered 2015-07-29: 1000 mL

## 2015-07-29 MED ORDER — FENTANYL CITRATE (PF) 100 MCG/2ML IJ SOLN
100.0000 ug | Freq: Once | INTRAMUSCULAR | Status: AC
  Administered 2015-07-29: 100 ug via INTRAVENOUS

## 2015-07-29 MED ORDER — BUPIVACAINE HCL (PF) 0.25 % IJ SOLN
INTRAMUSCULAR | Status: AC
  Filled 2015-07-29: qty 30

## 2015-07-29 MED ORDER — HYDROMORPHONE HCL 1 MG/ML IJ SOLN
0.2500 mg | INTRAMUSCULAR | Status: DC | PRN
  Administered 2015-07-29 (×4): 0.5 mg via INTRAVENOUS

## 2015-07-29 MED ORDER — HYDROMORPHONE HCL 1 MG/ML IJ SOLN
1.0000 mg | INTRAMUSCULAR | Status: DC | PRN
  Administered 2015-07-29 – 2015-07-31 (×11): 1 mg via INTRAVENOUS
  Filled 2015-07-29 (×13): qty 1

## 2015-07-29 MED ORDER — FENTANYL CITRATE (PF) 250 MCG/5ML IJ SOLN
INTRAMUSCULAR | Status: AC
  Filled 2015-07-29: qty 5

## 2015-07-29 MED ORDER — SODIUM CHLORIDE 0.9 % IV BOLUS (SEPSIS)
1000.0000 mL | Freq: Once | INTRAVENOUS | Status: AC
  Administered 2015-07-29: 1000 mL via INTRAVENOUS

## 2015-07-29 MED ORDER — PROPOFOL 10 MG/ML IV BOLUS
INTRAVENOUS | Status: DC | PRN
  Administered 2015-07-29: 15 mg via INTRAVENOUS

## 2015-07-29 MED ORDER — LIDOCAINE HCL (CARDIAC) 20 MG/ML IV SOLN
INTRAVENOUS | Status: DC | PRN
  Administered 2015-07-29: 60 mg via INTRAVENOUS

## 2015-07-29 MED ORDER — ROCURONIUM BROMIDE 50 MG/5ML IV SOLN
INTRAVENOUS | Status: AC
  Filled 2015-07-29: qty 1

## 2015-07-29 MED ORDER — CEFAZOLIN SODIUM-DEXTROSE 2-3 GM-% IV SOLR
2.0000 g | INTRAVENOUS | Status: AC
  Administered 2015-07-29: 2 g via INTRAVENOUS
  Filled 2015-07-29 (×2): qty 50

## 2015-07-29 MED ORDER — HYDROMORPHONE HCL 1 MG/ML IJ SOLN
0.5000 mg | INTRAMUSCULAR | Status: DC | PRN
  Administered 2015-07-29: 0.5 mg via INTRAVENOUS

## 2015-07-29 MED ORDER — OXYCODONE HCL 5 MG PO TABS
5.0000 mg | ORAL_TABLET | Freq: Once | ORAL | Status: AC | PRN
  Administered 2015-07-29: 5 mg via ORAL

## 2015-07-29 MED ORDER — BUPIVACAINE-EPINEPHRINE (PF) 0.5% -1:200000 IJ SOLN
INTRAMUSCULAR | Status: DC | PRN
  Administered 2015-07-29: 30 mL via PERINEURAL

## 2015-07-29 MED ORDER — LACTATED RINGERS IV SOLN
INTRAVENOUS | Status: DC | PRN
Start: 2015-07-29 — End: 2015-07-29
  Administered 2015-07-29: 13:00:00 via INTRAVENOUS

## 2015-07-29 MED ORDER — BUPIVACAINE HCL (PF) 0.25 % IJ SOLN
INTRAMUSCULAR | Status: DC | PRN
  Administered 2015-07-29: 7 mL

## 2015-07-29 MED ORDER — MIDAZOLAM HCL 2 MG/2ML IJ SOLN
INTRAMUSCULAR | Status: AC
  Filled 2015-07-29: qty 2

## 2015-07-29 MED ORDER — ONDANSETRON HCL 4 MG/2ML IJ SOLN
INTRAMUSCULAR | Status: DC | PRN
  Administered 2015-07-29: 4 mg via INTRAVENOUS

## 2015-07-29 MED ORDER — FENTANYL CITRATE (PF) 100 MCG/2ML IJ SOLN
INTRAMUSCULAR | Status: AC
  Administered 2015-07-29: 100 ug via INTRAVENOUS
  Filled 2015-07-29: qty 2

## 2015-07-29 MED ORDER — ROCURONIUM BROMIDE 100 MG/10ML IV SOLN
INTRAVENOUS | Status: DC | PRN
  Administered 2015-07-29: 50 mg via INTRAVENOUS

## 2015-07-29 MED ORDER — PROPOFOL 10 MG/ML IV BOLUS
INTRAVENOUS | Status: AC
  Filled 2015-07-29: qty 20

## 2015-07-29 MED ORDER — KETOROLAC TROMETHAMINE 30 MG/ML IJ SOLN: 30.0000 mg | Freq: Once | INTRAMUSCULAR | Status: DC

## 2015-07-29 SURGICAL SUPPLY — 59 items
BLADE SURG ROTATE 9660 (MISCELLANEOUS) ×2 IMPLANT
CANISTER SUCTION 2500CC (MISCELLANEOUS) ×2 IMPLANT
CHLORAPREP W/TINT 26ML (MISCELLANEOUS) ×1 IMPLANT
CLOSURE WOUND 1/2 X4 (GAUZE/BANDAGES/DRESSINGS) ×1
COVER SURGICAL LIGHT HANDLE (MISCELLANEOUS) ×3 IMPLANT
DECANTER SPIKE VIAL GLASS SM (MISCELLANEOUS) ×2 IMPLANT
DRAIN PENROSE 1/2X12 LTX STRL (WOUND CARE) ×2 IMPLANT
DRAPE LAPAROSCOPIC ABDOMINAL (DRAPES) ×2 IMPLANT
DRAPE LAPAROTOMY TRNSV 102X78 (DRAPE) ×1 IMPLANT
DRAPE UTILITY XL STRL (DRAPES) ×3 IMPLANT
ELECT CAUTERY BLADE 6.4 (BLADE) ×3 IMPLANT
ELECT REM PT RETURN 9FT ADLT (ELECTROSURGICAL) ×3
ELECTRODE REM PT RTRN 9FT ADLT (ELECTROSURGICAL) ×1 IMPLANT
GAUZE SPONGE 4X4 16PLY XRAY LF (GAUZE/BANDAGES/DRESSINGS) ×3 IMPLANT
GLOVE BIO SURGEON STRL SZ7 (GLOVE) ×7 IMPLANT
GLOVE BIOGEL PI IND STRL 7.0 (GLOVE) IMPLANT
GLOVE BIOGEL PI IND STRL 7.5 (GLOVE) ×1 IMPLANT
GLOVE BIOGEL PI IND STRL 8 (GLOVE) IMPLANT
GLOVE BIOGEL PI INDICATOR 7.0 (GLOVE) ×2
GLOVE BIOGEL PI INDICATOR 7.5 (GLOVE) ×2
GLOVE BIOGEL PI INDICATOR 8 (GLOVE) ×2
GLOVE SURG SS PI 7.5 STRL IVOR (GLOVE) ×2 IMPLANT
GOWN STRL REUS W/ TWL LRG LVL3 (GOWN DISPOSABLE) ×2 IMPLANT
GOWN STRL REUS W/TWL LRG LVL3 (GOWN DISPOSABLE) ×6
KIT BASIN OR (CUSTOM PROCEDURE TRAY) ×3 IMPLANT
KIT ROOM TURNOVER OR (KITS) ×3 IMPLANT
LIGASURE IMPACT 36 18CM CVD LR (INSTRUMENTS) ×2 IMPLANT
LIQUID BAND (GAUZE/BANDAGES/DRESSINGS) ×3 IMPLANT
MESH HERNIA SYS ULTRAPRO LRG (Mesh General) ×2 IMPLANT
NDL HYPO 25GX1X1/2 BEV (NEEDLE) ×1 IMPLANT
NEEDLE HYPO 25GX1X1/2 BEV (NEEDLE) ×3 IMPLANT
NS IRRIG 1000ML POUR BTL (IV SOLUTION) ×3 IMPLANT
PACK SURGICAL SETUP 50X90 (CUSTOM PROCEDURE TRAY) ×3 IMPLANT
PAD ARMBOARD 7.5X6 YLW CONV (MISCELLANEOUS) ×3 IMPLANT
PEN SKIN MARKING BROAD (MISCELLANEOUS) ×1 IMPLANT
PENCIL BUTTON HOLSTER BLD 10FT (ELECTRODE) ×3 IMPLANT
SPECIMEN JAR LARGE (MISCELLANEOUS) ×2 IMPLANT
SPONGE LAP 18X18 X RAY DECT (DISPOSABLE) ×3 IMPLANT
STRIP CLOSURE SKIN 1/2X4 (GAUZE/BANDAGES/DRESSINGS) ×1 IMPLANT
SUT MNCRL AB 4-0 PS2 18 (SUTURE) ×3 IMPLANT
SUT VIC AB 0 CT1 18XCR BRD 8 (SUTURE) ×1 IMPLANT
SUT VIC AB 0 CT1 27 (SUTURE)
SUT VIC AB 0 CT1 27XBRD ANBCTR (SUTURE) IMPLANT
SUT VIC AB 0 CT1 8-18 (SUTURE) ×3
SUT VIC AB 2-0 CT1 27 (SUTURE) ×3
SUT VIC AB 2-0 CT1 TAPERPNT 27 (SUTURE) ×1 IMPLANT
SUT VIC AB 2-0 SH 18 (SUTURE) ×3 IMPLANT
SUT VIC AB 2-0 SH 27 (SUTURE) ×3
SUT VIC AB 2-0 SH 27XBRD (SUTURE) IMPLANT
SUT VIC AB 3-0 SH 27 (SUTURE) ×6
SUT VIC AB 3-0 SH 27XBRD (SUTURE) ×1 IMPLANT
SUT VICRYL AB 2 0 TIES (SUTURE) ×3 IMPLANT
SYR BULB IRRIGATION 50ML (SYRINGE) ×2 IMPLANT
SYR CONTROL 10ML LL (SYRINGE) ×3 IMPLANT
TOWEL OR 17X24 6PK STRL BLUE (TOWEL DISPOSABLE) ×3 IMPLANT
TOWEL OR 17X26 10 PK STRL BLUE (TOWEL DISPOSABLE) ×3 IMPLANT
TUBE CONNECTING 12'X1/4 (SUCTIONS) ×1
TUBE CONNECTING 12X1/4 (SUCTIONS) ×1 IMPLANT
YANKAUER SUCT BULB TIP NO VENT (SUCTIONS) ×2 IMPLANT

## 2015-07-29 NOTE — ED Notes (Signed)
Pt from home, states he has a known hernia and while showering this morning "it did something different and doesn't feel right".

## 2015-07-29 NOTE — ED Notes (Signed)
Attempted report 

## 2015-07-29 NOTE — Anesthesia Preprocedure Evaluation (Addendum)
Anesthesia Evaluation  Patient identified by MRN, date of birth, ID band Patient awake    Reviewed: Allergy & Precautions, NPO status , Patient's Chart, lab work & pertinent test results  Airway Mallampati: II       Dental  (+) Teeth Intact   Pulmonary Current Smoker,    breath sounds clear to auscultation       Cardiovascular negative cardio ROS   Rhythm:regular Rate:Normal     Neuro/Psych    GI/Hepatic   Endo/Other    Renal/GU      Musculoskeletal   Abdominal   Peds  Hematology   Anesthesia Other Findings   Reproductive/Obstetrics                            Anesthesia Physical Anesthesia Plan  ASA: II  Anesthesia Plan: General and Regional   Post-op Pain Management: GA combined w/ Regional for post-op pain   Induction: Intravenous  Airway Management Planned: Oral ETT  Additional Equipment:   Intra-op Plan:   Post-operative Plan: Extubation in OR  Informed Consent: I have reviewed the patients History and Physical, chart, labs and discussed the procedure including the risks, benefits and alternatives for the proposed anesthesia with the patient or authorized representative who has indicated his/her understanding and acceptance.   Dental advisory given  Plan Discussed with: Anesthesiologist  Anesthesia Plan Comments:        Anesthesia Quick Evaluation

## 2015-07-29 NOTE — Progress Notes (Signed)
Report given to steve Emalynn Clewis rn as caregiver 
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
## 2015-07-29 NOTE — Transfer of Care (Signed)
Immediate Anesthesia Transfer of Care Note  Patient: Vincent Baldwin  Procedure(s) Performed: Procedure(s): REPAIR INCARCERATED LEFT INGUINAL HERNIA (Left)  Patient Location: PACU  Anesthesia Type:General  Level of Consciousness: awake and alert   Airway & Oxygen Therapy: Patient Spontanous Breathing and Patient connected to nasal cannula oxygen  Post-op Assessment: Report given to RN and Post -op Vital signs reviewed and stable  Post vital signs: Reviewed and stable  Last Vitals:  Filed Vitals:   07/29/15 1231 07/29/15 1232  BP:    Pulse: 46 48  Temp:    Resp: 11 15    Complications: No apparent anesthesia complications

## 2015-07-29 NOTE — Op Note (Signed)
Preoperative diagnosis: incarcerated LIH Postoperative diagnosis: Same as above Procedure: Left inguinal hernia repair with ultrapro hernia system Surgeon: Dr. Harden MoMatt Azura Baldwin Anesthesia: Gen. With TAP block Estimated blood loss: 25 cc Complications none Drains: None Specimens: omentum and hernia sac Sponge and needle count was correct   Indications: This is a [age] yom who presents with longstanding scrotal LIH.  This is incarcerated and tender. We discussed going to OR for [facility] LPIH repair.   Procedure: After informed consent was obtained the patient was taken to the operating room. He was given antibiotics. Sequential compression devices were on his legs. He underwent a TAP block. He was then placed under general anesthesia without complication. He was prepped and draped in the standard sterile surgical fashion. A surgical timeout was then performed.  I injected Marcaine in the right groin. I then made a right groin incision. I then entered the external oblique through the external ring. Most of his omentum was incarcerated in his scrotum. This with some difficulty was reduced including his testicle which it was adherent to.  I encircled his cord. He had a weak floor.  He did have a very large indirect hernia. I was able to isolate the hernia sac and incarcerated omentum from the remainder of the cord structures.  I then attempted to reduce all of this. I entered the sac and tried to reduce it. Not all of this would reduce. I then excised a portion of the omentum with the ligasure and the sac. I then ligated the sac and was able to reduce this.  I was able to identify all of this cord structures. I then placed a ultrapro hernia system into place and laid the bottom portion of the bilayer flat.  I then opened the top portion.  I sutured this into position to the pubic tubercle several times with a 2-0 Vicryl suture. I made a T cut and wrapped this around the spermatic cord. I secured this every half  centimeter to the inguinal ligament below. I laid the lateral portion flat. I sewed the cut ends of the mesh together as well as to the internal oblique. There was no evidence of any additional hernia and this completely completely covered the defect. I then obtained hemostasis. I then closed the external oblique with 2-0 Vicryl.Glue was applied to the incision and steristrips. He tolerated this well, was transferred to recovery. The testicle was present in the scrotum upon completion.

## 2015-07-29 NOTE — Anesthesia Procedure Notes (Addendum)
Procedure Name: Intubation Date/Time: 07/29/2015 12:50 PM Performed by: Reine JustFLOWERS, ROKOSHI T Pre-anesthesia Checklist: Patient identified, Emergency Drugs available, Suction available, Patient being monitored and Timeout performed Patient Re-evaluated:Patient Re-evaluated prior to inductionOxygen Delivery Method: Circle system utilized Preoxygenation: Pre-oxygenation with 100% oxygen Intubation Type: IV induction Ventilation: Mask ventilation without difficulty Laryngoscope Size: Mac and 4 Grade View: Grade I Tube type: Oral Tube size: 7.5 mm Number of attempts: 1 Airway Equipment and Method: Stylet Placement Confirmation: ETT inserted through vocal cords under direct vision,  positive ETCO2 and breath sounds checked- equal and bilateral Secured at: 22 cm Tube secured with: Tape Dental Injury: Teeth and Oropharynx as per pre-operative assessment    Anesthesia Regional Block:  TAP block  Pre-Anesthetic Checklist: ,, timeout performed, Correct Patient, Correct Site, Correct Laterality, Correct Procedure, Correct Position, site marked, Risks and benefits discussed,  Surgical consent,  Pre-op evaluation,  At surgeon's request and post-op pain management   Prep: chloraprep       Needles:  Injection technique: Single-shot  Needle Type: Echogenic Needle     Needle Length: 9cm 9 cm Needle Gauge: 21 and 21 G    Additional Needles:  Procedures: ultrasound guided (picture in chart) TAP block Narrative:  Start time: 07/29/2015 12:20 PM End time: 07/29/2015 12:31 PM Injection made incrementally with aspirations every 5 mL.  Performed by: Personally  Anesthesiologist: Arsalan Brisbin  Additional Notes: Pt tolerated the procedure well.

## 2015-07-30 ENCOUNTER — Encounter (HOSPITAL_COMMUNITY): Payer: Self-pay | Admitting: General Surgery

## 2015-07-30 MED ORDER — WHITE PETROLATUM GEL
Status: AC
  Administered 2015-07-30: 0.2
  Filled 2015-07-30: qty 1

## 2015-07-30 MED ORDER — IOHEXOL 300 MG/ML  SOLN
100.0000 mL | Freq: Once | INTRAMUSCULAR | Status: AC | PRN
  Administered 2015-07-29: 100 mL via INTRAVENOUS

## 2015-07-30 MED ORDER — METHOCARBAMOL 500 MG PO TABS
1000.0000 mg | ORAL_TABLET | Freq: Three times a day (TID) | ORAL | Status: DC | PRN
  Administered 2015-07-30 – 2015-07-31 (×2): 1000 mg via ORAL
  Filled 2015-07-30 (×2): qty 2

## 2015-07-30 MED ORDER — IBUPROFEN 600 MG PO TABS
600.0000 mg | ORAL_TABLET | Freq: Four times a day (QID) | ORAL | Status: DC | PRN
  Administered 2015-07-30 – 2015-07-31 (×3): 600 mg via ORAL
  Filled 2015-07-30 (×3): qty 1
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
## 2015-07-31 MED ORDER — IBUPROFEN 200 MG PO TABS: ORAL_TABLET | ORAL | Status: DC

## 2015-07-31 MED ORDER — OXYCODONE HCL 5 MG PO TABS: 5.0000 mg | ORAL_TABLET | Freq: Four times a day (QID) | ORAL | Status: DC | PRN

## 2015-07-31 NOTE — Progress Notes (Signed)
2 Days Post-Op  Subjective: He is very sore, scrotum and incision, but doing well otherwise.  Tolerating diet and voiding well.    Objective: Vital signs in last 24 hours: Temp:  [98.4 F (36.9 C)-98.5 F (36.9 C)] 98.5 F (36.9 C) (12/29 0444) Pulse Rate:  [60] 60 (12/29 0444) Resp:  [16-18] 18 (12/29 0444) BP: (111-115)/(66-75) 115/75 mmHg (12/29 0444) SpO2:  [99 %-100 %] 100 % (12/29 0444) Last BM Date: 07/28/15 960 PO Regular diet 2900 Urine Afebrile, VSS No labs  Intake/Output from previous day: 12/28 0701 - 12/29 0700 In: 960 [P.O.:960] Out: 2900 [Urine:2900] Intake/Output this shift:    General appearance: alert, cooperative and no distress GI: soft, tender at incision site, and lovenox injection site., Site looks good, + BS. Scrotum, with some testicular swelling site looks fine, very tender.  Lab Results:   Recent Labs  07/29/15 0531  WBC 5.2  HGB 14.4  HCT 41.0  PLT 226    BMET  Recent Labs  07/29/15 0531  NA 142  K 3.5  CL 108  CO2 25  GLUCOSE 120*  BUN 6  CREATININE 1.00  CALCIUM 9.2   PT/INR No results for input(s): LABPROT, INR in the last 72 hours.   Recent Labs Lab 07/29/15 0531  AST 21  ALT 22  ALKPHOS 84  BILITOT 0.3  PROT 6.7  ALBUMIN 4.1     Lipase     Component Value Date/Time   LIPASE 41 02/26/2013 1215     Studies/Results: No results found.  Medications:    Assessment/Plan Incarcerated LIH Left inguinal hernia repair with ultrapro hernia system, 07/19/15, Dr. Emelia LoronMatthew Wakefield Antibiotics:  None DVT: SCD/ no heparin or lovenox listed  Plan:  Home today follow up with DR. Wakefield in 2-3 weeks.Marland Kitchen.         Vincent Baldwin 07/31/2015

## 2015-07-31 NOTE — Anesthesia Postprocedure Evaluation (Signed)
Anesthesia Post Note  Patient: Vincent Baldwin  Procedure(s) Performed: Procedure(s) (LRB): REPAIR INCARCERATED LEFT INGUINAL HERNIA (Left)  Patient location during evaluation: PACU Anesthesia Type: General Level of consciousness: awake and alert and patient cooperative Pain management: pain level controlled Vital Signs Assessment: post-procedure vital signs reviewed and stable Respiratory status: spontaneous breathing and respiratory function stable Cardiovascular status: stable Anesthetic complications: no    Last Vitals:  Filed Vitals:   07/30/15 2109 07/31/15 0444  BP: 115/74 115/75  Pulse: 60 60  Temp: 36.9 C 36.9 C  Resp: 17 18    Last Pain:  Filed Vitals:   07/31/15 0808  PainSc: 5                  Lyndee Herbst S

## 2015-07-31 NOTE — Discharge Instructions (Signed)
[facility] _____[facility]  UMBILICAL OR INGUINAL HERNIA REPAIR: POST OP INSTRUCTIONS  Always review your discharge instruction sheet given to you by the facility where your surgery was performed. IF YOU HAVE DISABILITY OR FAMILY LEAVE FORMS, YOU MUST BRING THEM TO THE OFFICE FOR PROCESSING.   DO NOT GIVE THEM TO YOUR DOCTOR. Obtain some compression shorts and wear till all the swelling is completely resolved.  1. A  prescription for pain medication may be given to you upon discharge.  Take your pain medication as prescribed, if needed.  If narcotic pain medicine is not needed, then you may take acetaminophen (Tylenol) or ibuprofen (Advil) as needed. 2. Take your usually prescribed medications unless otherwise directed. 3. If you need a refill on your pain medication, please contact your pharmacy.  They will contact our office to request authorization. Prescriptions will not be filled after 5 pm or on week-ends. 4. You should follow a light diet the first 24 hours after arrival home, such as soup and crackers, etc.  Be sure to include lots of fluids daily.  Resume your normal diet the day after surgery. 5. Most patients will experience some swelling and bruising around the umbilicus or in the groin and scrotum.  Ice packs and reclining will help.  Swelling and bruising can take several days to resolve.  6. It is common to experience some constipation if taking pain medication after surgery.  Increasing fluid intake and taking a stool softener (such as Colace) will usually help or prevent this problem from occurring.  A mild laxative (Milk of Magnesia or Miralax) should be taken according to package directions if there are no bowel movements after 48 hours. 7. Unless discharge instructions indicate otherwise, you may remove your bandages 24-48 hours after surgery, and you may shower at that time.  You may have steri-strips (small skin tapes) in place directly over the incision.  These  strips should be left on the skin for 7-10 days.  If your surgeon used skin glue on the incision, you may shower in 24 hours.  The glue will flake off over the next 2-3 weeks.  Any sutures or staples will be removed at the office during your follow-up visit. 8. ACTIVITIES:  You may resume regular (light) daily activities beginning the next day--such as daily self-care, walking, climbing stairs--gradually increasing activities as tolerated.  You may have sexual intercourse when it is comfortable.  Refrain from any heavy lifting or straining until approved by your doctor. a. You may drive when you are no longer taking prescription pain medication, you can comfortably wear a seatbelt, and you can safely maneuver your car and apply brakes. b. RETURN TO WORK:  __________________________________________________________ 9. You should see your doctor in the office for a follow-up appointment approximately 2-3 weeks after your surgery.  Make sure that you call for this appointment within a day or two after you arrive home to insure a convenient appointment time. 10. OTHER INSTRUCTIONS:  __________________________________________________________________________________________________________________________________________________________________________________________  WHEN TO CALL YOUR DOCTOR: 1. Fever over 101.0 2. Inability to urinate 3. Nausea and/or vomiting 4. Extreme swelling or bruising 5. Continued bleeding from incision. 6. Increased pain, redness, or drainage from the incision  The clinic staff is available to answer your questions during regular business hours.  Please dont hesitate to call and ask to speak to one of the nurses for clinical concerns.  If you have a medical emergency, go to the nearest emergency room or call 911.  A surgeon  from [facility] is always on call at the hospital   [address], [address], [place], [place]  [address] ?  [address] [address], [place]  [place]   [address] [phone] ? [phone] ? FAX [phone] Web site: [email or web address]
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
## 2015-08-05 ENCOUNTER — Encounter (HOSPITAL_COMMUNITY): Payer: Self-pay | Admitting: Emergency Medicine

## 2015-08-05 ENCOUNTER — Emergency Department (HOSPITAL_COMMUNITY)
Admission: EM | Admit: 2015-08-05 | Discharge: 2015-08-05 | Disposition: A | Discharge status: Home / Self Care | Payer: Self-pay | Attending: Emergency Medicine | Admitting: Emergency Medicine

## 2015-08-05 ENCOUNTER — Emergency Department (HOSPITAL_COMMUNITY): Payer: Self-pay

## 2015-08-05 DIAGNOSIS — Y9289 Other specified places as the place of occurrence of the external cause: Secondary | ICD-10-CM | POA: Insufficient documentation

## 2015-08-05 DIAGNOSIS — W228XXA Striking against or struck by other objects, initial encounter: Secondary | ICD-10-CM | POA: Insufficient documentation

## 2015-08-05 DIAGNOSIS — F17200 Nicotine dependence, unspecified, uncomplicated: Secondary | ICD-10-CM | POA: Insufficient documentation

## 2015-08-05 DIAGNOSIS — Z23 Encounter for immunization: Secondary | ICD-10-CM | POA: Insufficient documentation

## 2015-08-05 DIAGNOSIS — Y9389 Activity, other specified: Secondary | ICD-10-CM | POA: Insufficient documentation

## 2015-08-05 DIAGNOSIS — R42 Dizziness and giddiness: Secondary | ICD-10-CM | POA: Insufficient documentation

## 2015-08-05 DIAGNOSIS — S0101XA Laceration without foreign body of scalp, initial encounter: Secondary | ICD-10-CM | POA: Insufficient documentation

## 2015-08-05 DIAGNOSIS — Y998 Other external cause status: Secondary | ICD-10-CM | POA: Insufficient documentation

## 2015-08-05 DIAGNOSIS — S0191XA Laceration without foreign body of unspecified part of head, initial encounter: Secondary | ICD-10-CM

## 2015-08-05 MED ORDER — ONDANSETRON 4 MG PO TBDP
4.0000 mg | ORAL_TABLET | Freq: Once | ORAL | Status: AC
  Administered 2015-08-05: 4 mg via ORAL
  Filled 2015-08-05: qty 1

## 2015-08-05 MED ORDER — TETANUS-DIPHTH-ACELL PERTUSSIS 5-2.5-18.5 LF-MCG/0.5 IM SUSP
0.5000 mL | Freq: Once | INTRAMUSCULAR | Status: AC
  Administered 2015-08-05: 0.5 mL via INTRAMUSCULAR
  Filled 2015-08-05: qty 0.5

## 2015-08-05 MED ORDER — LIDOCAINE-EPINEPHRINE (PF) 2 %-1:200000 IJ SOLN
20.0000 mL | Freq: Once | INTRAMUSCULAR | Status: AC
  Administered 2015-08-05: 20 mL
  Filled 2015-08-05: qty 20

## 2015-08-05 MED ORDER — OXYCODONE-ACETAMINOPHEN 5-325 MG PO TABS
1.0000 | ORAL_TABLET | Freq: Once | ORAL | Status: AC
  Administered 2015-08-05: 1 via ORAL
  Filled 2015-08-05: qty 1

## 2015-08-05 NOTE — Discharge Instructions (Signed)
Head Injury, Adult You have a head injury. Headaches and throwing up (vomiting) are common after a head injury. It should be easy to wake up from sleeping. Sometimes you must stay in the hospital. Most problems happen within the first 24 hours. Side effects may occur up to 7-10 days after the injury.  WHAT ARE THE TYPES OF HEAD INJURIES? Head injuries can be as minor as a bump. Some head injuries can be more severe. More severe head injuries include:  A jarring injury to the brain (concussion).  A bruise of the brain (contusion). This mean there is bleeding in the brain that can cause swelling.  A cracked skull (skull fracture).  Bleeding in the brain that collects, clots, and forms a bump (hematoma). WHEN SHOULD I GET HELP RIGHT AWAY?   You are confused or sleepy.  You cannot be woken up.  You feel sick to your stomach (nauseous) or keep throwing up (vomiting).  Your dizziness or unsteadiness is getting worse.  You have very bad, lasting headaches that are not helped by medicine. Take medicines only as told by your doctor.  You cannot use your arms or legs like normal.  You cannot walk.  You notice changes in the black spots in the center of the colored part of your eye (pupil).  You have clear or bloody fluid coming from your nose or ears.  You have trouble seeing. During the next 24 hours after the injury, you must stay with someone who can watch you. This person should get help right away (call 911 in the [place] if you start to shake and are not able to control it (have seizures), you pass out, or you are unable to wake up. HOW CAN I PREVENT A HEAD INJURY IN THE FUTURE?  Wear seat belts.  Wear a helmet while bike riding and playing sports like football.  Stay away from dangerous activities around the house. WHEN CAN I RETURN TO NORMAL ACTIVITIES AND ATHLETICS? See your doctor before doing these activities. You should not do normal activities or play contact sports until 1  week after the following symptoms have stopped:  Headache that does not go away.  Dizziness.  Poor attention.  Confusion.  Memory problems.  Sickness to your stomach or throwing up.  Tiredness.  Fussiness.  Bothered by bright lights or loud noises.  Anxiousness or depression.  Restless sleep. MAKE SURE YOU:   Understand these instructions.  Will watch your condition.  Will get help right away if you are not doing well or get worse.   This information is not intended to replace advice given to you by your health care provider. Make sure you discuss any questions you have with your health care provider.   Document Released: 07/01/2008 Document Revised: 08/09/2014 Document Reviewed: 03/26/2013 Elsevier Interactive Patient Education 2016 [place], [place], or Adhesive Wound Closure Health care providers use stitches (sutures), staples, and certain glue (skin adhesives) to hold skin together while it heals (wound closure). You may need this treatment after you have surgery or if you cut your skin accidentally. These methods help your skin to heal more quickly and make it less likely that you will have a scar. A wound may take several months to heal completely. The type of wound you have determines when your wound gets closed. In most cases, the wound is closed as soon as possible (primary skin closure). Sometimes, closure is delayed so the wound can be cleaned and allowed to heal  naturally. This reduces the chance of infection. Delayed closure may be needed if your wound: °· Is caused by a bite. °· Happened more than 6 hours ago. °· Involves loss of skin or the tissues under the skin. °· Has dirt or debris in it that cannot be removed. °· Is infected. °WHAT ARE THE DIFFERENT KINDS OF WOUND CLOSURES? °There are many options for wound closure. The one that your health care provider uses depends on how deep and how large your wound is. °Adhesive Glue °To use this type of  glue to close a wound, your health care provider holds the edges of the wound together and paints the glue on the surface of your skin. You may need more than one layer of glue. Then the wound may be covered with a light bandage (dressing). °This type of skin closure may be used for small wounds that are not deep (superficial). Using glue for wound closure is less painful than other methods. It does not require a medicine that numbs the area (local anesthetic). This method also leaves nothing to be removed. Adhesive glue is often used for children and on facial wounds. °Adhesive glue cannot be used for wounds that are deep, uneven, or bleeding. It is not used inside of a wound.  °Adhesive Strips °These strips are made of sticky (adhesive), porous paper. They are applied across your skin edges like a regular adhesive bandage. You leave them on until they fall off. °Adhesive strips may be used to close very superficial wounds. They may also be used along with sutures to improve the closure of your skin edges.  °Sutures °Sutures are the oldest method of wound closure. Sutures can be made from natural substances, such as silk, or from synthetic materials, such as nylon and steel. They can be made from a material that your body can break down as your wound heals (absorbable), or they can be made from a material that needs to be removed from your skin (nonabsorbable). They come in many different strengths and sizes. °Your health care provider attaches the sutures to a steel needle on one end. Sutures can be passed through your skin, or through the tissues beneath your skin. Then they are tied and cut. Your skin edges may be closed in one continuous stitch or in separate stitches. °Sutures are strong and can be used for all kinds of wounds. Absorbable sutures may be used to close tissues under the skin. The disadvantage of sutures is that they may cause skin reactions that lead to infection. Nonabsorbable sutures need to  be removed. °Staples °When surgical staples are used to close a wound, the edges of your skin on both sides of the wound are brought close together. A staple is placed across the wound, and an instrument secures the edges together. Staples are often used to close surgical cuts (incisions). °Staples are faster to use than sutures, and they cause less skin reaction. Staples need to be removed using a tool that bends the staples away from your skin. °HOW DO I CARE FOR MY WOUND CLOSURE? °· Take medicines only as directed by your health care provider. °· If you were prescribed an antibiotic medicine for your wound, finish it all even if you start to feel better. °· Use ointments or creams only as directed by your health care provider. °· Wash your hands with soap and water before and after touching your wound. °· Do not soak your wound in water. Do not take baths, swim, or   use a hot tub until your health care provider approves.  Ask your health care provider when you can start showering. Cover your wound if directed by your health care provider.  Do not take out your own sutures or staples.  Do not pick at your wound. Picking can cause an infection.  Keep all follow-up visits as directed by your health care provider. This is important. HOW LONG WILL I HAVE MY WOUND CLOSURE?  Leave adhesive glue on your skin until the glue peels away.  Leave adhesive strips on your skin until the strips fall off.  Absorbable sutures will dissolve within several days.  Nonabsorbable sutures and staples must be removed. The location of the wound will determine how long they stay in. This can range from several days to a couple of weeks. WHEN SHOULD I SEEK HELP FOR MY WOUND CLOSURE? Contact your health care provider if:  You have a fever.  You have chills.  You have drainage, redness, swelling, or pain at your wound.  There is a bad smell coming from your wound.  The skin edges of your wound start to separate  after your sutures have been removed.  Your wound becomes thick, raised, and darker in color after your sutures come out (scarring).   This information is not intended to replace advice given to you by your health care provider. Make sure you discuss any questions you have with your health care provider.   Document Released: 04/13/2001 Document Revised: 08/09/2014 Document Reviewed: 12/26/2013 Elsevier Interactive Patient Education 2016 Elsevier Inc[employer]  Wound Care Taking care of your wound properly can help to prevent pain and infection. It can also help your wound to heal more quickly.  HOW TO CARE FOR YOUR WOUND  Take or apply over-the-counter and prescription medicines only as told by your health care provider.  If you were prescribed antibiotic medicine, take or apply it as told by your health care provider. Do not stop using the antibiotic even if your condition improves.  Clean the wound each day or as told by your health care provider.  Wash the wound with mild soap and water.  Rinse the wound with water to remove all soap.  Pat the wound dry with a clean towel. Do not rub it.  There are many different ways to close and cover a wound. For example, a wound can be covered with stitches (sutures), skin glue, or adhesive strips. Follow instructions from your health care provider about:  How to take care of your wound.  When and how you should change your bandage (dressing).  When you should remove your dressing.  Removing whatever was used to close your wound.  Check your wound every day for signs of infection. Watch for:  Redness, swelling, or pain.  Fluid, blood, or pus.  Keep the dressing dry until your health care provider says it can be removed. Do not take baths, swim, use a hot tub, or do anything that would put your wound underwater until your health care provider approves.  Raise (elevate) the injured area above the level of your heart while you are sitting or  lying down.  Do not scratch or pick at the wound.  Keep all follow-up visits as told by your health care provider. This is important. SEEK MEDICAL CARE IF:  You received a tetanus shot and you have swelling, severe pain, redness, or bleeding at the injection site.  You have a fever.  Your pain is not controlled with medicine.  You have increased redness, swelling, or pain at the site of your wound.  You have fluid, blood, or pus coming from your wound.  You notice a bad smell coming from your wound or your dressing. SEEK IMMEDIATE MEDICAL CARE IF:  You have a red streak going away from your wound.   This information is not intended to replace advice given to you by your health care provider. Make sure you discuss any questions you have with your health care provider.   Follow-up in this department in 10-14 days to have staples removed. Keep area clean and dry. May wash with soap and water. Return to the emergency department if you experience severe increase in your pain, redness or swelling around the wound, fever.
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
## 2015-08-05 NOTE — ED Notes (Signed)
Pt. Left with all belongings. Discharge instructions were reviewed and all questions were answered.  
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
## 2015-08-06 ENCOUNTER — Emergency Department (HOSPITAL_COMMUNITY)
Admission: EM | Admit: 2015-08-06 | Discharge: 2015-08-06 | Disposition: A | Discharge status: Home / Self Care | Payer: Self-pay | Attending: Emergency Medicine | Admitting: Emergency Medicine

## 2015-08-06 ENCOUNTER — Encounter (HOSPITAL_COMMUNITY): Payer: Self-pay | Admitting: *Deleted

## 2015-08-06 DIAGNOSIS — G8918 Other acute postprocedural pain: Secondary | ICD-10-CM | POA: Insufficient documentation

## 2015-08-06 DIAGNOSIS — N5089 Other specified disorders of the male genital organs: Secondary | ICD-10-CM | POA: Insufficient documentation

## 2015-08-06 DIAGNOSIS — Z9889 Other specified postprocedural states: Secondary | ICD-10-CM | POA: Insufficient documentation

## 2015-08-06 DIAGNOSIS — F17200 Nicotine dependence, unspecified, uncomplicated: Secondary | ICD-10-CM | POA: Insufficient documentation

## 2015-08-06 MED ORDER — OXYCODONE-ACETAMINOPHEN 5-325 MG PO TABS: 1.0000 | ORAL_TABLET | ORAL | Status: DC | PRN

## 2015-08-06 MED ORDER — OXYCODONE-ACETAMINOPHEN 5-325 MG PO TABS
2.0000 | ORAL_TABLET | Freq: Once | ORAL | Status: AC
  Administered 2015-08-06: 2 via ORAL
  Filled 2015-08-06: qty 2

## 2015-08-06 MED ORDER — ONDANSETRON 4 MG PO TBDP
4.0000 mg | ORAL_TABLET | Freq: Once | ORAL | Status: AC
  Administered 2015-08-06: 4 mg via ORAL
  Filled 2015-08-06: qty 1

## 2015-08-06 NOTE — ED Notes (Signed)
Pt refuses to wait, leaving after triage.

## 2015-08-06 NOTE — Discharge Instructions (Signed)
Please ice and elevate your testicl Cryotherapy Cryotherapy means treatment with cold. Ice or gel packs can be used to reduce both pain and swelling. Ice is the most helpful within the first 24 to 48 hours after an injury or flare-up from overusing a muscle or joint. Sprains, strains, spasms, burning pain, shooting pain, and aches can all be eased with ice. Ice can also be used when recovering from surgery. Ice is effective, has very few side effects, and is safe for most people to use. PRECAUTIONS  Ice is not a safe treatment option for people with:  Raynaud phenomenon. This is a condition affecting small blood vessels in the extremities. Exposure to cold may cause your problems to return.  Cold hypersensitivity. There are many forms of cold hypersensitivity, including:  Cold urticaria. Red, itchy hives appear on the skin when the tissues begin to warm after being iced.  Cold erythema. This is a red, itchy rash caused by exposure to cold.  Cold hemoglobinuria. Red blood cells break down when the tissues begin to warm after being iced. The hemoglobin that carry oxygen are passed into the urine because they cannot combine with blood proteins fast enough.  Numbness or altered sensitivity in the area being iced. If you have any of the following conditions, do not use ice until you have discussed cryotherapy with your caregiver:  Heart conditions, such as arrhythmia, angina, or chronic heart disease.  High blood pressure.  Healing wounds or open skin in the area being iced.  Current infections.  Rheumatoid arthritis.  Poor circulation.  Diabetes. Ice slows the blood flow in the region it is applied. This is beneficial when trying to stop inflamed tissues from spreading irritating chemicals to surrounding tissues. However, if you expose your skin to cold temperatures for too long or without the proper protection, you can damage your skin or nerves. Watch for signs of skin damage due to  cold. HOME CARE INSTRUCTIONS Follow these tips to use ice and cold packs safely.  Place a dry or damp towel between the ice and skin. A damp towel will cool the skin more quickly, so you may need to shorten the time that the ice is used.  For a more rapid response, add gentle compression to the ice.  Ice for no more than 10 to 20 minutes at a time. The bonier the area you are icing, the less time it will take to get the benefits of ice.  Check your skin after 5 minutes to make sure there are no signs of a poor response to cold or skin damage.  Rest 20 minutes or more between uses.  Once your skin is numb, you can end your treatment. You can test numbness by very lightly touching your skin. The touch should be so light that you do not see the skin dimple from the pressure of your fingertip. When using ice, most people will feel these normal sensations in this order: cold, burning, aching, and numbness.  Do not use ice on someone who cannot communicate their responses to pain, such as small children or people with dementia. HOW TO MAKE AN ICE PACK Ice packs are the most common way to use ice therapy. Other methods include ice massage, ice baths, and cryosprays. Muscle creams that cause a cold, tingly feeling do not offer the same benefits that ice offers and should not be used as a substitute unless recommended by your caregiver. To make an ice pack, do one of the following:  Place crushed ice or a bag of frozen vegetables in a sealable plastic bag. Squeeze out the excess air. Place this bag inside another plastic bag. Slide the bag into a pillowcase or place a damp towel between your skin and the bag.  Mix 3 parts water with 1 part rubbing alcohol. Freeze the mixture in a sealable plastic bag. When you remove the mixture from the freezer, it will be slushy. Squeeze out the excess air. Place this bag inside another plastic bag. Slide the bag into a pillowcase or place a damp towel between your  skin and the bag. SEEK MEDICAL CARE IF:  You develop white spots on your skin. This may give the skin a blotchy (mottled) appearance.  Your skin turns blue or pale.  Your skin becomes waxy or hard.  Your swelling gets worse. MAKE SURE YOU:   Understand these instructions.  Will watch your condition.  Will get help right away if you are not doing well or get worse.   This information is not intended to replace advice given to you by your health care provider. Make sure you discuss any questions you have with your health care provider.   Document Released: 03/15/2011 Document Revised: 08/09/2014 Document Reviewed: 03/15/2011 Elsevier Interactive Patient Education [employer] e. Return to the emergency department if your pain is severe and uncontrolled, if you're unable to walk. [facility]. Call first thing in the morning.

## 2015-08-06 NOTE — ED Notes (Signed)
Provider at bedside
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
## 2015-08-15 ENCOUNTER — Encounter (HOSPITAL_COMMUNITY): Payer: Self-pay | Admitting: Family Medicine

## 2015-08-15 ENCOUNTER — Emergency Department (HOSPITAL_COMMUNITY)
Admission: EM | Admit: 2015-08-15 | Discharge: 2015-08-15 | Disposition: A | Discharge status: Home / Self Care | Payer: Self-pay | Attending: Emergency Medicine | Admitting: Emergency Medicine

## 2015-08-15 ENCOUNTER — Emergency Department (HOSPITAL_COMMUNITY): Payer: Self-pay

## 2015-08-15 DIAGNOSIS — N5089 Other specified disorders of the male genital organs: Secondary | ICD-10-CM

## 2015-08-15 DIAGNOSIS — F17200 Nicotine dependence, unspecified, uncomplicated: Secondary | ICD-10-CM | POA: Insufficient documentation

## 2015-08-15 DIAGNOSIS — Z8719 Personal history of other diseases of the digestive system: Secondary | ICD-10-CM | POA: Insufficient documentation

## 2015-08-15 DIAGNOSIS — N5082 Scrotal pain: Secondary | ICD-10-CM

## 2015-08-15 DIAGNOSIS — N50819 Testicular pain, unspecified: Secondary | ICD-10-CM

## 2015-08-15 DIAGNOSIS — Z791 Long term (current) use of non-steroidal anti-inflammatories (NSAID): Secondary | ICD-10-CM | POA: Insufficient documentation

## 2015-08-15 LAB — BASIC METABOLIC PANEL
Anion gap: 11 (ref 5–15)
CALCIUM: 9.8 mg/dL (ref 8.9–10.3)
CO2: 26 mmol/L (ref 22–32)
Chloride: 104 mmol/L (ref 101–111)
Creatinine, Ser: 0.93 mg/dL (ref 0.61–1.24)
GFR calc Af Amer: >60 mL/min (ref >60)
GLUCOSE: 102 mg/dL — AB (ref 65–99)
POTASSIUM: 3.6 mmol/L (ref 3.5–5.1)
SODIUM: 141 mmol/L (ref 135–145)

## 2015-08-15 LAB — CBC WITH DIFFERENTIAL/PLATELET
BASOS ABS: 0 10*3/uL (ref 0.0–0.1)
Basophils Relative: 1 %
EOS ABS: 0.1 10*3/uL (ref 0.0–0.7)
EOS PCT: 1 %
HCT: 41 % (ref 39.0–52.0)
Hemoglobin: 14.2 g/dL (ref 13.0–17.0)
LYMPHS ABS: 1.5 10*3/uL (ref 0.7–4.0)
Lymphocytes Relative: 28 %
MCH: 31.8 pg (ref 26.0–34.0)
MCHC: 34.6 g/dL (ref 30.0–36.0)
MCV: 91.9 fL (ref 78.0–100.0)
MONO ABS: 0.5 10*3/uL (ref 0.1–1.0)
Monocytes Relative: 10 %
Neutro Abs: 3.3 10*3/uL (ref 1.7–7.7)
Neutrophils Relative %: 60 %
PLATELETS: 316 10*3/uL (ref 150–400)
RBC: 4.46 MIL/uL (ref 4.22–5.81)
RDW: 12.6 % (ref 11.5–15.5)
WBC: 5.4 10*3/uL (ref 4.0–10.5)

## 2015-08-15 MED ORDER — OXYCODONE-ACETAMINOPHEN 5-325 MG PO TABS: 2.0000 | ORAL_TABLET | ORAL | Status: DC | PRN

## 2015-08-15 MED ORDER — KETOROLAC TROMETHAMINE 60 MG/2ML IM SOLN
60.0000 mg | Freq: Once | INTRAMUSCULAR | Status: AC
  Administered 2015-08-15: 60 mg via INTRAMUSCULAR
  Filled 2015-08-15: qty 2
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
## 2015-08-15 NOTE — Discharge Instructions (Signed)
You have been seen today for scrotal swelling and pain. Follow-up with your surgeon on this issue. Follow-up with urology as soon as possible on this matter. Dr. Jasmine AweHerrick's office should call you on Monday to set up an appointment. Follow up with PCP as needed. Return to ED should symptoms worsen. Use Tylenol or Percocet for pain. Apply ice and 15 minute intervals to reduce swelling. Wear tight fitting underwear and keep the area elevated as often as possible.   Emergency Department Resource Guide 1) Find a Doctor and Pay Out of Pocket Although you won't have to find out who is covered by your insurance plan, it is a good idea to ask around and get recommendations. You will then need to call the office and see if the doctor you have chosen will accept you as a new patient and what types of options they offer for patients who are self-pay. Some doctors offer discounts or will set up payment plans for their patients who do not have insurance, but you will need to ask so you aren't surprised when you get to your appointment.  2) Contact Your Local Health Department Not all health departments have doctors that can see patients for sick visits, but many do, so it is worth a call to see if yours does. If you don't know where your local health department is, you can check in your phone book. The CDC also has a tool to help you locate your state's health department, and many state websites also have listings of all of their local health departments.  3) Find a Walk-in Clinic If your illness is not likely to be very severe or complicated, you may want to try a walk in clinic. These are popping up all over the country in pharmacies, drugstores, and shopping centers. They're usually staffed by nurse practitioners or physician assistants that have been trained to treat common illnesses and complaints. They're usually fairly quick and inexpensive. However, if you have serious medical issues or chronic medical problems,  these are probably not your best option.  No Primary Care Doctor: - Call Health Connect at  [phone] - they can help you locate a primary care doctor that  accepts your insurance, provides certain services, etc. - Physician Referral Service- [phone]  Chronic Pain Problems: Organization         Address  Phone   Notes  [facility]  [phone]) [phone] Patients need to be referred by their primary care doctor.   Medication Assistance: Organization         Address  Phone   Notes  [facility] Passistance Program [address] [place], [place] [address] [phone]) [phone] --Must be a resident of [place] [facility] [place] -- Must have NO insurance coverage whatsoever (no Medicaid/ Medicare, etc.) -- The pt. MUST have a primary care doctor that directs their care regularly and follows them in the community   MedAssist  [phone]   [employer]  [phone]    Agencies that provide inexpensive medical care: Organization         Address  Phone   Notes  [facility]  [phone]   [facility]    [phone]) [phone]   [facility] [address] [place], [place] [address] [phone]   [facility] [address], [place] [phone]   [facility]anned Parenthood    [phone](336) [phone]   [facility]    [phone]   [address], [place] Phone:  [phone], Fax:  [phone] Hours of Operation:  9 am - 6 pm, M-F.  Also accepts Medicaid/Medicare and self-pay.  [facility] for [address] [place], [address], [place] Phone: [phone], Fax: [phone]. Hours of Operation:  8:30 am - 5:30 pm, M-F.  Also accepts Medicaid and self-pay.  [facility] [place] [address] [place] Phone: [phone]   [place], [place], [place] [phone], Ext. [phone] Mondays &  Thursdays: 7-9 AM.  First 15 patients are seen on a first come, first serve basis.    [place] Providers:  Organization         Address  Phone   Notes  [facility] [address], [place] [phone] Also accepts self-pay patients.  [facility] [address], [place]  [phone]   [place], [address], [place] [phone]   [facility] [address], [place] [phone]   Lucianne Lei [address], [place]   [phone] Only accepts [place] Access [place] patients after they have their name applied to their card.   Self-Pay (no insurance) in [facility]:  Organization         Address  Phone   Notes  Sickle Cell Patients, [facility] [place] [phone]   [facility] [place] [phone]   [facility] [place]  [place], [place], [place] [phone]   [facility]/Dr. Osei-Bonsu  [address] [place] or [address], [place] [phone] Phone number for both [place] and [place] locations is the same.  [facility] [address] [place] [phone]   [facility] [address] [place] or [address] [phone]   [facility] [address] [place] [phone], phone; [phone], fax Sees patients 1st and 3rd Saturday of every month.  Must not qualify for public or private insurance (i.e. Medicaid, Medicare, [place] Health Choice, Veterans' Benefits)  Household income should be no more than 200% of the poverty level The clinic cannot treat you if you are pregnant or think you are pregnant  Sexually transmitted diseases are not treated at the  clinic.    Dental Care: Organization         Address  Phone  Notes  [facility] [address] Department of [facility] [place] [phone] Accepts children up to age 51 who are enrolled in [place] or [place]; pregnant women with a Medicaid card; and children who have applied for Medicaid or [place] Health Choice, but were declined, whose parents can pay a reduced fee at time of service.  [facility] Department of [facility]  [address], [place] [phone] Accepts children up to age 79 who are enrolled in [place] or [place]; pregnant women with a Medicaid card; and children who have applied for Medicaid or [place] Health Choice, but were declined, whose parents can pay a reduced fee at time of service.  [place]  Access PROGRAM  [place] [phone] Patients are seen by appointment only. Walk-ins are not accepted. [place] will see patients 18 years of age and older. Monday - Tuesday (8am-5pm) Most Wednesdays (8:30-5pm) $30 per visit, cash only  [facility] Adult Dental Access PROGRAM  [address], [facility] [phone] Patients are seen by appointment only. Walk-ins are not accepted. [place] will see patients 66 years of age and older. One Wednesday Evening (Monthly: Volunteer Based).  $30 per visit, cash only  [place]  [phone] for adults; Children under age 79, call Graduate Pediatric Dentistry at [phone]. Children aged 46-14, please call [phone] to request a pediatric application.  Dental services are provided in all areas of dental care including fillings, crowns and bridges, complete and partial dentures, implants, gum treatment, root canals, and extractions. Preventive care is also provided. Treatment is provided to both adults and children. Patients are selected via a lottery and there is often a  waiting list.   [facility] [address] [place]  [phone] [email or web address]   [facility] [address] [place], [place] [phone], Ext. [phone] Second and Fourth Thursday of each month, opens at 6:30 AM; Clinic ends at 9 AM.  Patients are seen on a first-come first-served basis, and a limited number are seen during each clinic.   [facility]  [address] Hillard Danker [place], [place] [phone]   Eligibility Requirements You must have lived in [place], [place], or [place] counties for at least the last three months.   You cannot be eligible for state or federal sponsored [employer], including [employer], [place], or [employer].   You generally cannot be eligible for healthcare insurance through your employer.    How to apply: Eligibility screenings are held every Tuesday and Wednesday afternoon from 1:00 pm until 4:00 pm. You do not need an appointment for the interview!  [facility] [address] [place], [place]   [place]  [place] Department  [place]  [phone]    Behavioral Health Resources in the Community: Intensive Outpatient Programs Organization         Address  Phone  Notes  [place], [place], [place] [phone]   [facility] [address] [place], [place]   ADS: Alcohol & Drug Svcs [address], [place], [place]   [place] [address]  [place], [place] or [phone]   Substance Abuse Resources Organization         Address  Phone  Notes  Alcohol and Drug Services  [phone]   [place]  [phone]   [facility] [place]   Chinita Pester  [phone]   Residential & Outpatient Substance Abuse  Program  [phone]   Psychological Services Organization         Address  Phone  Notes  [facility] [place]  [place]  [phone]   [place] [address] [place] or [phone]    Mobile Crisis Teams Organization         Address  Phone  Notes  [facility], Mobile Crisis Care Unit  [phone]   [facility]  [address] [place],  [place] [address] [place] [phone]    Self-Help/Support Groups Organization         Address  Phone             Notes  [facility] of [place] - variety of support groups  [place] Call for more information  Narcotics Anonymous (NA), [facility]aring [facility] [address], [employer] [place]  2 meetings at this location   Special educational needs teacher         Address  Phone  Notes  ASAP Residential Treatment [place],    [place]  [phone]   [facility]  [address] [place] [phone], [place], [place]   [place], [place] [phone] Admissions: 8am-3pm M-F  Incentives Substance [place] [address],    [place], [place] [phone]   [facility] [address] [place], [place], [place]   [facility] [address],  [place], [place]   [facility]ight Programs - Intensive Outpatient [address], [place], [place]   [facility] ([place].,  [place], [place] [phone] or [phone]   [facility] Services (RTS) [address], [place], [place] Accepts Medicaid  [facility] [place] [address],  [place] [phone] Substance Abuse/Addiction Treatment   [facility] Organization          Address  Phone  Notes  [facility]  [phone]   Domenic Schwab, PhD [address] Arlis Porta [place], [place]   [phone] or [phone]   [place], [place] [phone]   [facility] [address], [place], [place] [phone] Insurance/Medicaid/sponsorship through [facility] and Families [address]                                    [place], [place] [phone] [place] [address] [place], [place] [phone]    Dr. Adele Schilder  [phone]   [facility] Dept. 1) [address], [place] 2) [place] 3)  [place] [address], [place] [phone]  [phone]   [place] [phone] or [phone] (After Hours)

## 2015-08-15 NOTE — ED Notes (Signed)
Called pt to be taken back to room 2X, no response.
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
## 2015-08-18 ENCOUNTER — Telehealth (HOSPITAL_BASED_OUTPATIENT_CLINIC_OR_DEPARTMENT_OTHER): Payer: Self-pay | Admitting: Emergency Medicine

## 2015-08-20 ENCOUNTER — Emergency Department (HOSPITAL_COMMUNITY)
Admission: EM | Admit: 2015-08-20 | Discharge: 2015-08-20 | Disposition: A | Discharge status: Home / Self Care | Payer: MEDICAID

## 2015-08-27 ENCOUNTER — Telehealth (HOSPITAL_BASED_OUTPATIENT_CLINIC_OR_DEPARTMENT_OTHER): Payer: Self-pay | Admitting: Emergency Medicine

## 2016-06-17 ENCOUNTER — Emergency Department (HOSPITAL_COMMUNITY)
Admission: EM | Admit: 2016-06-17 | Discharge: 2016-06-17 | Disposition: A | Discharge status: Home / Self Care | Payer: Self-pay | Attending: Dermatology | Admitting: Dermatology

## 2016-06-17 ENCOUNTER — Encounter (HOSPITAL_COMMUNITY): Payer: Self-pay | Admitting: *Deleted

## 2016-06-17 DIAGNOSIS — R1030 Lower abdominal pain, unspecified: Secondary | ICD-10-CM | POA: Insufficient documentation

## 2016-06-17 DIAGNOSIS — F17200 Nicotine dependence, unspecified, uncomplicated: Secondary | ICD-10-CM | POA: Insufficient documentation

## 2016-06-17 DIAGNOSIS — Z5321 Procedure and treatment not carried out due to patient leaving prior to being seen by health care provider: Secondary | ICD-10-CM | POA: Insufficient documentation

## 2016-06-17 NOTE — ED Triage Notes (Signed)
Patient presents with c/o groin pain after having surgery in January

## 2016-06-17 NOTE — ED Notes (Signed)
Pt is in A10 with spouse

## 2016-06-17 NOTE — ED Notes (Signed)
Pt cursing at throughout waiting area, through hall and into room regarding wait times. Pt repeatedly ask to not curse. Attempts made to explain to patient regarding long wait times.  Pt states he no longer wants to be seen
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
## 2018-09-25 ENCOUNTER — Encounter (HOSPITAL_COMMUNITY): Payer: Self-pay | Admitting: Family Medicine

## 2018-09-25 ENCOUNTER — Ambulatory Visit (HOSPITAL_COMMUNITY)
Admission: EM | Admit: 2018-09-25 | Discharge: 2018-09-25 | Disposition: A | Discharge status: Home / Self Care | Payer: Self-pay | Attending: Family Medicine | Admitting: Family Medicine

## 2018-09-25 DIAGNOSIS — Z202 Contact with and (suspected) exposure to infections with a predominantly sexual mode of transmission: Secondary | ICD-10-CM | POA: Insufficient documentation

## 2018-09-25 NOTE — Discharge Instructions (Signed)
Bacterial vaginitis is not a sexually contagious disease.

## 2018-09-25 NOTE — ED Triage Notes (Signed)
Triaged by provider  

## 2018-09-27 LAB — URINE CYTOLOGY ANCILLARY ONLY
Chlamydia: NEGATIVE
Neisseria Gonorrhea: NEGATIVE
Trichomonas: NEGATIVE

## 2018-09-28 LAB — URINE CYTOLOGY ANCILLARY ONLY

## 2020-01-28 ENCOUNTER — Other Ambulatory Visit: Payer: Self-pay

## 2020-01-28 ENCOUNTER — Encounter (HOSPITAL_COMMUNITY): Payer: Self-pay | Admitting: Emergency Medicine

## 2020-01-28 ENCOUNTER — Ambulatory Visit (HOSPITAL_COMMUNITY)
Admission: EM | Admit: 2020-01-28 | Discharge: 2020-01-28 | Disposition: A | Discharge status: Home / Self Care | Payer: Self-pay | Attending: Family Medicine | Admitting: Family Medicine

## 2020-01-28 DIAGNOSIS — M79672 Pain in left foot: Secondary | ICD-10-CM

## 2020-01-28 DIAGNOSIS — G8929 Other chronic pain: Secondary | ICD-10-CM

## 2020-01-28 DIAGNOSIS — N4830 Priapism, unspecified: Secondary | ICD-10-CM

## 2020-01-28 DIAGNOSIS — N5089 Other specified disorders of the male genital organs: Secondary | ICD-10-CM

## 2020-01-28 MED ORDER — MELOXICAM 15 MG PO TABS: 15.0000 mg | ORAL_TABLET | Freq: Every day | ORAL | 0 refills | Status: DC
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
## 2020-01-28 NOTE — ED Triage Notes (Signed)
Heel of left foot is painful for 2 months.  No known injury.    Abdominal pain, center abdomen.  Patient gives a vague description.  Last bm was this morning, patient reports blood in stool.    Multiple complaints.  Patient has left lower abdominal pain  Pain for 3-4 days.  Patient has taken left over pain pills.  (hydrocodone)

## 2020-01-28 NOTE — Discharge Instructions (Addendum)
Use ice to your heel when it is painful Take the meloxicam once  a day with food This is an anti inflammatory pain medicine You need a heel pad/heel cup in your shoe- you can get at a pharmacy or [facility]  Call the urology doctor for an appointment.  This is for the lump in your scrotum and the problems you are having after your hernia surgery

## 2022-10-26 ENCOUNTER — Other Ambulatory Visit: Payer: Self-pay

## 2022-10-26 ENCOUNTER — Inpatient Hospital Stay (HOSPITAL_COMMUNITY)
Admission: EM | Admit: 2022-10-26 | Discharge: 2022-10-29 | DRG: 378 | Disposition: A | Discharge status: Home / Self Care | Payer: Medicaid Other | Attending: Family Medicine | Admitting: Family Medicine

## 2022-10-26 ENCOUNTER — Emergency Department (HOSPITAL_COMMUNITY): Payer: Medicaid Other

## 2022-10-26 ENCOUNTER — Encounter (HOSPITAL_COMMUNITY): Payer: Self-pay | Admitting: *Deleted

## 2022-10-26 DIAGNOSIS — Z791 Long term (current) use of non-steroidal anti-inflammatories (NSAID): Secondary | ICD-10-CM

## 2022-10-26 DIAGNOSIS — D62 Acute posthemorrhagic anemia: Secondary | ICD-10-CM | POA: Diagnosis present

## 2022-10-26 DIAGNOSIS — K5731 Diverticulosis of large intestine without perforation or abscess with bleeding: Principal | ICD-10-CM | POA: Diagnosis present

## 2022-10-26 DIAGNOSIS — Z87891 Personal history of nicotine dependence: Secondary | ICD-10-CM

## 2022-10-26 DIAGNOSIS — H539 Unspecified visual disturbance: Secondary | ICD-10-CM | POA: Diagnosis present

## 2022-10-26 DIAGNOSIS — K922 Gastrointestinal hemorrhage, unspecified: Secondary | ICD-10-CM | POA: Insufficient documentation

## 2022-10-26 DIAGNOSIS — K625 Hemorrhage of anus and rectum: Secondary | ICD-10-CM

## 2022-10-26 DIAGNOSIS — D649 Anemia, unspecified: Secondary | ICD-10-CM

## 2022-10-26 DIAGNOSIS — K649 Unspecified hemorrhoids: Principal | ICD-10-CM

## 2022-10-26 DIAGNOSIS — K640 First degree hemorrhoids: Secondary | ICD-10-CM | POA: Diagnosis present

## 2022-10-26 DIAGNOSIS — Z91013 Allergy to seafood: Secondary | ICD-10-CM

## 2022-10-26 DIAGNOSIS — Z87828 Personal history of other (healed) physical injury and trauma: Secondary | ICD-10-CM

## 2022-10-26 DIAGNOSIS — R519 Headache, unspecified: Secondary | ICD-10-CM

## 2022-10-26 DIAGNOSIS — R1032 Left lower quadrant pain: Secondary | ICD-10-CM | POA: Diagnosis present

## 2022-10-26 HISTORY — DX: Accidental discharge from unspecified firearms or gun, initial encounter: W34.00XA

## 2022-10-26 LAB — CBC WITH DIFFERENTIAL/PLATELET
Abs Immature Granulocytes: 0.01 10*3/uL (ref 0.00–0.07)
Basophils Absolute: 0 10*3/uL (ref 0.0–0.1)
Basophils Relative: 1 %
Eosinophils Absolute: 0.1 10*3/uL (ref 0.0–0.5)
Eosinophils Relative: 2 %
HCT: 22.6 % — ABNORMAL LOW (ref 39.0–52.0)
Hemoglobin: 7.1 g/dL — ABNORMAL LOW (ref 13.0–17.0)
Immature Granulocytes: 0 %
Lymphocytes Relative: 48 %
Lymphs Abs: 2.5 10*3/uL (ref 0.7–4.0)
MCH: 27 pg (ref 26.0–34.0)
MCHC: 31.4 g/dL (ref 30.0–36.0)
MCV: 85.9 fL (ref 80.0–100.0)
Monocytes Absolute: 0.6 10*3/uL (ref 0.1–1.0)
Monocytes Relative: 10 %
Neutro Abs: 2.1 10*3/uL (ref 1.7–7.7)
Neutrophils Relative %: 39 %
Platelets: 320 10*3/uL (ref 150–400)
RBC: 2.63 MIL/uL — ABNORMAL LOW (ref 4.22–5.81)
RDW: 14.7 % (ref 11.5–15.5)
WBC: 5.3 10*3/uL (ref 4.0–10.5)
nRBC: 0 % (ref 0.0–0.2)

## 2022-10-26 MED ORDER — MAGNESIUM SULFATE 2 GM/50ML IV SOLN
2.0000 g | Freq: Once | INTRAVENOUS | Status: AC
  Administered 2022-10-26: 2 g via INTRAVENOUS
  Filled 2022-10-26: qty 50

## 2022-10-26 MED ORDER — ACETAMINOPHEN 500 MG PO TABS
1000.0000 mg | ORAL_TABLET | Freq: Once | ORAL | Status: AC
  Administered 2022-10-26: 1000 mg via ORAL
  Filled 2022-10-26: qty 2

## 2022-10-26 MED ORDER — KETOROLAC TROMETHAMINE 30 MG/ML IJ SOLN
15.0000 mg | Freq: Once | INTRAMUSCULAR | Status: AC
  Administered 2022-10-26: 15 mg via INTRAVENOUS
  Filled 2022-10-26: qty 1
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
## 2022-10-26 NOTE — ED Notes (Signed)
Visual Acuity Screening: Both eyes:20/40, rt.eye:20/40 lt eye:20/40
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
## 2022-10-27 ENCOUNTER — Emergency Department (HOSPITAL_COMMUNITY): Payer: Medicaid Other

## 2022-10-27 ENCOUNTER — Encounter (HOSPITAL_COMMUNITY): Payer: Self-pay

## 2022-10-27 DIAGNOSIS — K649 Unspecified hemorrhoids: Secondary | ICD-10-CM

## 2022-10-27 DIAGNOSIS — H539 Unspecified visual disturbance: Secondary | ICD-10-CM | POA: Diagnosis present

## 2022-10-27 DIAGNOSIS — D62 Acute posthemorrhagic anemia: Secondary | ICD-10-CM | POA: Diagnosis present

## 2022-10-27 DIAGNOSIS — R519 Headache, unspecified: Secondary | ICD-10-CM

## 2022-10-27 DIAGNOSIS — D509 Iron deficiency anemia, unspecified: Secondary | ICD-10-CM | POA: Diagnosis not present

## 2022-10-27 DIAGNOSIS — D649 Anemia, unspecified: Secondary | ICD-10-CM

## 2022-10-27 DIAGNOSIS — Z91013 Allergy to seafood: Secondary | ICD-10-CM | POA: Diagnosis not present

## 2022-10-27 DIAGNOSIS — R1032 Left lower quadrant pain: Secondary | ICD-10-CM | POA: Diagnosis present

## 2022-10-27 DIAGNOSIS — K5730 Diverticulosis of large intestine without perforation or abscess without bleeding: Secondary | ICD-10-CM | POA: Diagnosis not present

## 2022-10-27 DIAGNOSIS — K640 First degree hemorrhoids: Secondary | ICD-10-CM | POA: Diagnosis present

## 2022-10-27 DIAGNOSIS — Z87891 Personal history of nicotine dependence: Secondary | ICD-10-CM | POA: Diagnosis not present

## 2022-10-27 DIAGNOSIS — K625 Hemorrhage of anus and rectum: Secondary | ICD-10-CM | POA: Diagnosis present

## 2022-10-27 DIAGNOSIS — K922 Gastrointestinal hemorrhage, unspecified: Secondary | ICD-10-CM | POA: Diagnosis not present

## 2022-10-27 DIAGNOSIS — K5731 Diverticulosis of large intestine without perforation or abscess with bleeding: Secondary | ICD-10-CM | POA: Diagnosis present

## 2022-10-27 DIAGNOSIS — Z791 Long term (current) use of non-steroidal anti-inflammatories (NSAID): Secondary | ICD-10-CM | POA: Diagnosis not present

## 2022-10-27 LAB — COMPREHENSIVE METABOLIC PANEL
ALT: 25 U/L (ref 0–44)
AST: 21 U/L (ref 15–41)
Albumin: 4.2 g/dL (ref 3.5–5.0)
Alkaline Phosphatase: 74 U/L (ref 38–126)
Anion gap: 8 (ref 5–15)
BUN: 7 mg/dL (ref 6–20)
CO2: 24 mmol/L (ref 22–32)
Calcium: 8.7 mg/dL — ABNORMAL LOW (ref 8.9–10.3)
Chloride: 107 mmol/L (ref 98–111)
Creatinine, Ser: 0.86 mg/dL (ref 0.61–1.24)
GFR, Estimated: >60 mL/min (ref >60)
Glucose, Bld: 98 mg/dL (ref 70–99)
Potassium: 3.6 mmol/L (ref 3.5–5.1)
Sodium: 139 mmol/L (ref 135–145)
Total Bilirubin: 0.5 mg/dL (ref 0.3–1.2)
Total Protein: 7.4 g/dL (ref 6.5–8.1)

## 2022-10-27 LAB — BASIC METABOLIC PANEL
Anion gap: 7 (ref 5–15)
BUN: 7 mg/dL (ref 6–20)
CO2: 24 mmol/L (ref 22–32)
Calcium: 8.5 mg/dL — ABNORMAL LOW (ref 8.9–10.3)
Chloride: 106 mmol/L (ref 98–111)
Creatinine, Ser: 0.93 mg/dL (ref 0.61–1.24)
GFR, Estimated: >60 mL/min (ref >60)
Glucose, Bld: 93 mg/dL (ref 70–99)
Potassium: 4 mmol/L (ref 3.5–5.1)
Sodium: 137 mmol/L (ref 135–145)

## 2022-10-27 LAB — HEMOGLOBIN AND HEMATOCRIT, BLOOD
HCT: 21.1 % — ABNORMAL LOW (ref 39.0–52.0)
HCT: 30 % — ABNORMAL LOW (ref 39.0–52.0)
Hemoglobin: 6.7 g/dL — CL (ref 13.0–17.0)
Hemoglobin: 9.9 g/dL — ABNORMAL LOW (ref 13.0–17.0)

## 2022-10-27 LAB — PREPARE RBC (CROSSMATCH)

## 2022-10-27 MED ORDER — IOHEXOL 300 MG/ML  SOLN
100.0000 mL | Freq: Once | INTRAMUSCULAR | Status: AC | PRN
  Administered 2022-10-27: 100 mL via INTRAVENOUS

## 2022-10-27 MED ORDER — SODIUM CHLORIDE (PF) 0.9 % IJ SOLN
INTRAMUSCULAR | Status: AC
  Filled 2022-10-27: qty 50

## 2022-10-27 MED ORDER — ONDANSETRON HCL 4 MG/2ML IJ SOLN: 4.0000 mg | Freq: Four times a day (QID) | INTRAMUSCULAR | Status: DC | PRN

## 2022-10-27 MED ORDER — PHENYLEPHRINE IN HARD FAT 0.25 % RE SUPP
1.0000 | Freq: Two times a day (BID) | RECTAL | Status: AC
  Filled 2022-10-27 (×2): qty 1

## 2022-10-27 MED ORDER — MORPHINE SULFATE (PF) 2 MG/ML IV SOLN: 1.0000 mg | Freq: Once | INTRAVENOUS | Status: DC

## 2022-10-27 MED ORDER — ACETAMINOPHEN 325 MG PO TABS: 650.0000 mg | ORAL_TABLET | Freq: Four times a day (QID) | ORAL | Status: DC | PRN

## 2022-10-27 MED ORDER — PEG 3350-KCL-NA BICARB-NACL 420 G PO SOLR
4000.0000 mL | Freq: Once | ORAL | Status: AC
  Administered 2022-10-27: 4000 mL via ORAL

## 2022-10-27 MED ORDER — ACETAMINOPHEN 650 MG RE SUPP: 650.0000 mg | Freq: Four times a day (QID) | RECTAL | Status: DC | PRN

## 2022-10-27 MED ORDER — SODIUM CHLORIDE 0.9% IV SOLUTION: Freq: Once | INTRAVENOUS | Status: AC

## 2022-10-27 MED ORDER — ONDANSETRON HCL 4 MG PO TABS: 4.0000 mg | ORAL_TABLET | Freq: Four times a day (QID) | ORAL | Status: DC | PRN

## 2022-10-27 MED ORDER — POTASSIUM CHLORIDE IN NACL 20-0.9 MEQ/L-% IV SOLN
INTRAVENOUS | Status: DC
  Filled 2022-10-27 (×3): qty 1000
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
## 2022-10-27 NOTE — Progress Notes (Addendum)
  Progress Note   Patient: Vincent Baldwin [id number] DOB: 10-19-1985 DOA: 10/26/2022     0 DOS: the patient was seen and examined on 10/27/2022   Brief hospital course: 37 year old man PMH rectal bleeding, no previous GI workup, presented with headache, dizziness.  Found to have significant anemia, admitted for GI bleed.  Assessment and Plan: GIB acute/chronic with ABLA/subacute anemia, symptomatic History of hemorrhoids and chronic bleed with acute worsening.  Significant anemia noted, symptomatic.  Imaging of the abdomen pelvis unremarkable. Transfuse PRBC, trend hemoglobin Remain NPO.  GI consultation pending.   Left groin pain History of inguinal hernia repair. CT scan done no abnormalities noted.  Can follow-up as an outpatient.  Hemodynamic stable.  Can transfer to [place].    Subjective:  Feels ok now No pain No bleeding currently Has had bleeding for 2 years Worse lately Some clots  Physical Exam: Vitals:   10/27/22 0530 10/27/22 0600 10/27/22 0700 10/27/22 0852  BP: 130/61 122/69  122/70  Pulse: (!) 52 (!) 52 (!) 57 (!) 35  Resp: 14 14 (!) 21 16  Temp:    98.2 F (36.8 C)  TempSrc:    Axillary  SpO2: 100% 100% 100% 100%  Weight:      Height:       Physical Exam Vitals reviewed.  Cardiovascular:     Rate and Rhythm: Normal rate and regular rhythm.     Heart sounds: No murmur heard. Pulmonary:     Effort: Pulmonary effort is normal. No respiratory distress.     Breath sounds: No wheezing, rhonchi or rales.  Abdominal:     General: There is no distension.     Palpations: Abdomen is soft.     Tenderness: There is no abdominal tenderness.  Neurological:     Mental Status: He is alert.  Psychiatric:        Mood and Affect: Mood normal.        Behavior: Behavior normal.    Data Reviewed: BMP noted Hgb 7.1 > 6.7  Family Communication: none  Disposition: Status is: Inpatient Remains inpatient appropriate because: GIB  Planned Discharge  Destination: Home    Time spent: 20 minutes  Author: Murray Hodgkins, MD 10/27/2022 9:19 AM  For on call review [email or web address].

## 2022-10-27 NOTE — ED Provider Notes (Signed)
[place] EMERGENCY DEPARTMENT AT [facility] Provider Note   CSN: [id number] Arrival date & time: 10/26/22  2031     History  Chief Complaint  Patient presents with   Headache   Rectal Bleeding    Vincent Baldwin is a 37 y.o. male.  The history is provided by the patient.  Headache Pain location:  L temporal and R temporal Quality:  Dull Radiates to:  Does not radiate Onset quality:  Gradual Duration:  6 days Timing:  Constant Progression:  Waxing and waning Chronicity:  New Relieved by:  Nothing Worsened by:  Nothing Associated symptoms: no congestion, no cough, no eye pain, no facial pain, no myalgias, no neck pain, no neck stiffness, no numbness, no paresthesias, no photophobia, no sinus pressure, no tingling and no URI   Associated symptoms comment:  Dry eyes vs blurry vision  Patient has had years of rectal bleeding on tissue and in bowel and was told to use hemorrhoid preparation which was uncomfortable to insert so he is not currently using this      Home Medications Prior to Admission medications   Medication Sig Start Date End Date Taking? Authorizing Provider  acetaminophen (TYLENOL) 325 MG tablet Take 650 mg by mouth every 6 (six) hours as needed for mild pain.    [provider]  meloxicam (MOBIC) 15 MG tablet Take 1 tablet (15 mg total) by mouth daily. 01/28/20   Raylene Everts, MD      Allergies    Shellfish allergy    Review of Systems   Review of Systems  HENT:  Negative for congestion and sinus pressure.   Eyes:  Negative for photophobia and pain.  Respiratory:  Negative for cough.   Gastrointestinal:  Positive for anal bleeding.  Musculoskeletal:  Negative for myalgias, neck pain and neck stiffness.  Neurological:  Positive for headaches. Negative for numbness and paresthesias.    Physical Exam Updated Vital Signs BP (!) 123/57 (BP Location: Right Arm)   Pulse 67   Temp 98.3 F (36.8 C) (Oral)   Resp 18   SpO2  98%  Physical Exam Vitals and nursing note reviewed.  Constitutional:      General: He is not in acute distress.    Appearance: Normal appearance. He is well-developed. He is not diaphoretic.  HENT:     Head: Normocephalic and atraumatic.     Nose: Nose normal.  Eyes:     Conjunctiva/sclera: Conjunctivae normal.     Pupils: Pupils are equal, round, and reactive to light.  Cardiovascular:     Rate and Rhythm: Normal rate and regular rhythm.     Pulses: Normal pulses.     Heart sounds: Normal heart sounds.  Pulmonary:     Effort: Pulmonary effort is normal.     Breath sounds: Normal breath sounds. No wheezing or rales.  Abdominal:     General: Bowel sounds are normal.     Palpations: Abdomen is soft.     Tenderness: There is no abdominal tenderness. There is no guarding or rebound.  Genitourinary:    Comments: Internal hemorrhoid, chaperone present blood on glove Musculoskeletal:        General: Normal range of motion.     Cervical back: Normal range of motion and neck supple.  Skin:    General: Skin is warm and dry.     Capillary Refill: Capillary refill takes less than 2 seconds.  Neurological:     General: No focal deficit  present.     Mental Status: He is alert and oriented to person, place, and time.     Deep Tendon Reflexes: Reflexes normal.  Psychiatric:        Mood and Affect: Mood normal.        Behavior: Behavior normal.     ED Results / Procedures / Treatments   Labs (all labs ordered are listed, but only abnormal results are displayed) Results for orders placed or performed during the hospital encounter of 10/26/22  CBC with Differential  Result Value Ref Range   WBC 5.3 4.0 - 10.5 K/uL   RBC 2.63 (L) 4.22 - 5.81 MIL/uL   Hemoglobin 7.1 (L) 13.0 - 17.0 g/dL   HCT 22.6 (L) 39.0 - 52.0 %   MCV 85.9 80.0 - 100.0 fL   MCH 27.0 26.0 - 34.0 pg   MCHC 31.4 30.0 - 36.0 g/dL   RDW 14.7 11.5 - 15.5 %   Platelets 320 150 - 400 K/uL   nRBC 0.0 0.0 - 0.2 %    Neutrophils Relative % 39 %   Neutro Abs 2.1 1.7 - 7.7 K/uL   Lymphocytes Relative 48 %   Lymphs Abs 2.5 0.7 - 4.0 K/uL   Monocytes Relative 10 %   Monocytes Absolute 0.6 0.1 - 1.0 K/uL   Eosinophils Relative 2 %   Eosinophils Absolute 0.1 0.0 - 0.5 K/uL   Basophils Relative 1 %   Basophils Absolute 0.0 0.0 - 0.1 K/uL   Immature Granulocytes 0 %   Abs Immature Granulocytes 0.01 0.00 - 0.07 K/uL  Comprehensive metabolic panel  Result Value Ref Range   Sodium 139 135 - 145 mmol/L   Potassium 3.6 3.5 - 5.1 mmol/L   Chloride 107 98 - 111 mmol/L   CO2 24 22 - 32 mmol/L   Glucose, Bld 98 70 - 99 mg/dL   BUN 7 6 - 20 mg/dL   Creatinine, Ser 0.86 0.61 - 1.24 mg/dL   Calcium 8.7 (L) 8.9 - 10.3 mg/dL   Total Protein 7.4 6.5 - 8.1 g/dL   Albumin 4.2 3.5 - 5.0 g/dL   AST 21 15 - 41 U/L   ALT 25 0 - 44 U/L   Alkaline Phosphatase 74 38 - 126 U/L   Total Bilirubin 0.5 0.3 - 1.2 mg/dL   GFR, Estimated >60 >60 mL/min   Anion gap 8 5 - 15  Type and screen [place]  Result Value Ref Range   ABO/RH(D) A POS    Antibody Screen NEG    Sample Expiration      10/30/2022,2359 Performed at [facility] [place] [address] [place], [place] [address]    CT ABDOMEN PELVIS W CONTRAST  Result Date: 10/27/2022 CLINICAL DATA:  Rectal bleeding. EXAM: CT ABDOMEN AND PELVIS WITH CONTRAST TECHNIQUE: Multidetector CT imaging of the abdomen and pelvis was performed using the standard protocol following bolus administration of intravenous contrast. RADIATION DOSE REDUCTION: This exam was performed according to the departmental dose-optimization program which includes automated exposure control, adjustment of the mA and/or kV according to patient size and/or use of iterative reconstruction technique. CONTRAST:  133mL OMNIPAQUE IOHEXOL 300 MG/ML  SOLN COMPARISON:  July 29, 2015 FINDINGS: Lower chest: No acute abnormality. Hepatobiliary: No focal liver abnormality is seen. No  gallstones, gallbladder wall thickening, or biliary dilatation. Pancreas: Unremarkable. No pancreatic ductal dilatation or surrounding inflammatory changes. Spleen: Normal in size without focal abnormality. Adrenals/Urinary Tract: Adrenal glands are unremarkable. Kidneys  are normal, without renal calculi, focal lesion, or hydronephrosis. The urinary bladder is markedly distended and is otherwise unremarkable. Stomach/Bowel: Stomach is within normal limits. Appendix appears normal. No evidence of bowel wall thickening, distention, or inflammatory changes. Vascular/Lymphatic: No significant vascular findings are present. No enlarged abdominal or pelvic lymph nodes. Reproductive: Prostate is unremarkable. Other: There is evidence of interval left inguinal hernia repair since the prior study. A small, stable fat containing umbilical hernia is noted. No abdominopelvic ascites. Musculoskeletal: No acute or significant osseous findings. IMPRESSION: 1. No acute findings in the abdomen or pelvis. 2. Evidence of interval left inguinal hernia repair since the prior study. Electronically Signed   By: Virgina Norfolk M.D.   On: 10/27/2022 01:13   CT Head Wo Contrast  Result Date: 10/26/2022 CLINICAL DATA:  Headache EXAM: CT HEAD WITHOUT CONTRAST TECHNIQUE: Contiguous axial images were obtained from the base of the skull through the vertex without intravenous contrast. RADIATION DOSE REDUCTION: This exam was performed according to the departmental dose-optimization program which includes automated exposure control, adjustment of the mA and/or kV according to patient size and/or use of iterative reconstruction technique. COMPARISON:  None Available. FINDINGS: Brain: There is no mass, hemorrhage or extra-axial collection. The size and configuration of the ventricles and extra-axial CSF spaces are normal. The brain parenchyma is normal, without acute or chronic infarction. Vascular: No abnormal hyperdensity of the major  intracranial arteries or dural venous sinuses. No intracranial atherosclerosis. Skull: The visualized skull base, calvarium and extracranial soft tissues are normal. Sinuses/Orbits: No fluid levels or advanced mucosal thickening of the visualized paranasal sinuses. No mastoid or middle ear effusion. The orbits are normal. IMPRESSION: Normal head CT. Electronically Signed   By: Ulyses Jarred M.D.   On: 10/26/2022 22:33     Radiology CT Head Wo Contrast  Result Date: 10/26/2022 CLINICAL DATA:  Headache EXAM: CT HEAD WITHOUT CONTRAST TECHNIQUE: Contiguous axial images were obtained from the base of the skull through the vertex without intravenous contrast. RADIATION DOSE REDUCTION: This exam was performed according to the departmental dose-optimization program which includes automated exposure control, adjustment of the mA and/or kV according to patient size and/or use of iterative reconstruction technique. COMPARISON:  None Available. FINDINGS: Brain: There is no mass, hemorrhage or extra-axial collection. The size and configuration of the ventricles and extra-axial CSF spaces are normal. The brain parenchyma is normal, without acute or chronic infarction. Vascular: No abnormal hyperdensity of the major intracranial arteries or dural venous sinuses. No intracranial atherosclerosis. Skull: The visualized skull base, calvarium and extracranial soft tissues are normal. Sinuses/Orbits: No fluid levels or advanced mucosal thickening of the visualized paranasal sinuses. No mastoid or middle ear effusion. The orbits are normal. IMPRESSION: Normal head CT. Electronically Signed   By: Ulyses Jarred M.D.   On: 10/26/2022 22:33    Procedures Procedures    Medications Ordered in ED Medications  acetaminophen (TYLENOL) tablet 1,000 mg (1,000 mg Oral Given 10/26/22 2205)  magnesium sulfate IVPB 2 g 50 mL (0 g Intravenous Stopped 10/27/22 0008)  ketorolac (TORADOL) 30 MG/ML injection 15 mg (15 mg Intravenous Given  10/26/22 2338)    ED Course/ Medical Decision Making/ A&P                             Medical Decision Making Patient with headache and years of BRBPR  Problems Addressed: Headache disorder:    Details: Treated and resolved in the ED.  CT is normal   Visual Acuity  Right Eye Distance:   Left Eye Distance:   Bilateral Distance:    Right Eye Near:   Left Eye Near:    Bilateral Near: 20/40     Amount and/or Complexity of Data Reviewed External Data Reviewed: notes.    Details: Previous notes reviewed  Labs: ordered.    Details: All labs reviewed:  normal white 5.3, low hemoglobin 7.1, normal platelet 320.  Normal sodium 139, normal potassium 3.6, normal creatinine .87 Radiology: ordered and independent interpretation performed.    Details: Negative CT by me  Discussion of management or test interpretation with external provider(s): Secure chat sent to Dr. Benson Norway for consult on GI bleed Case d/w Dr. Claria Dice who will admit the patient   Risk Prescription drug management. Decision regarding hospitalization.    Final Clinical Impression(s) / ED Diagnoses Final diagnoses:  Hemorrhoids, unspecified hemorrhoid type  Headache disorder   The patient appears reasonably stabilized for admission considering the current resources, flow, and capabilities available in the ED at this time, and I doubt any other [facility] requiring further screening and/or treatment in the ED prior to admission.  Rx / DC Orders ED Discharge Orders     None         Rajah Tagliaferro, MD 10/27/22 [id number]
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
## 2022-10-27 NOTE — Hospital Course (Addendum)
37 year old man PMH rectal bleeding, no previous GI workup, presented with headache, dizziness.  Found to have significant anemia, admitted for GI bleed.

## 2022-10-28 ENCOUNTER — Encounter (HOSPITAL_COMMUNITY): Payer: Self-pay | Admitting: Family Medicine

## 2022-10-28 ENCOUNTER — Inpatient Hospital Stay (HOSPITAL_COMMUNITY): Payer: Medicaid Other | Admitting: Certified Registered Nurse Anesthetist

## 2022-10-28 ENCOUNTER — Encounter (HOSPITAL_COMMUNITY)
Admission: EM | Disposition: A | Discharge status: Home / Self Care | Payer: Self-pay | Source: Home / Self Care | Attending: Family Medicine

## 2022-10-28 DIAGNOSIS — D509 Iron deficiency anemia, unspecified: Secondary | ICD-10-CM | POA: Diagnosis not present

## 2022-10-28 DIAGNOSIS — Z87891 Personal history of nicotine dependence: Secondary | ICD-10-CM | POA: Diagnosis not present

## 2022-10-28 DIAGNOSIS — D62 Acute posthemorrhagic anemia: Secondary | ICD-10-CM | POA: Diagnosis not present

## 2022-10-28 DIAGNOSIS — K922 Gastrointestinal hemorrhage, unspecified: Secondary | ICD-10-CM | POA: Diagnosis not present

## 2022-10-28 HISTORY — PX: ESOPHAGOGASTRODUODENOSCOPY (EGD) WITH PROPOFOL: SHX5813

## 2022-10-28 LAB — TYPE AND SCREEN
ABO/RH(D): A POS
Antibody Screen: NEGATIVE
Unit division: 0
Unit division: 0

## 2022-10-28 LAB — BPAM RBC
Blood Product Expiration Date: 202404182359
Blood Product Expiration Date: 202404182359
ISSUE DATE / TIME: 202403270900
ISSUE DATE / TIME: 202403271140
Unit Type and Rh: 6200
Unit Type and Rh: 6200

## 2022-10-28 LAB — HEMOGLOBIN AND HEMATOCRIT, BLOOD
HCT: 27.4 % — ABNORMAL LOW (ref 39.0–52.0)
Hemoglobin: 9 g/dL — ABNORMAL LOW (ref 13.0–17.0)

## 2022-10-28 LAB — CBC
HCT: 28.3 % — ABNORMAL LOW (ref 39.0–52.0)
Hemoglobin: 9 g/dL — ABNORMAL LOW (ref 13.0–17.0)
MCH: 27.7 pg (ref 26.0–34.0)
MCHC: 31.8 g/dL (ref 30.0–36.0)
MCV: 87.1 fL (ref 80.0–100.0)
Platelets: 291 10*3/uL (ref 150–400)
RBC: 3.25 MIL/uL — ABNORMAL LOW (ref 4.22–5.81)
RDW: 14.7 % (ref 11.5–15.5)
WBC: 6.5 10*3/uL (ref 4.0–10.5)
nRBC: 0 % (ref 0.0–0.2)

## 2022-10-28 SURGERY — ESOPHAGOGASTRODUODENOSCOPY (EGD) WITH PROPOFOL
Anesthesia: Monitor Anesthesia Care

## 2022-10-28 SURGERY — COLONOSCOPY WITH PROPOFOL
Anesthesia: Monitor Anesthesia Care

## 2022-10-28 MED ORDER — PROPOFOL 500 MG/50ML IV EMUL
INTRAVENOUS | Status: DC | PRN
  Administered 2022-10-28: 130 ug/kg/min via INTRAVENOUS

## 2022-10-28 MED ORDER — PROPOFOL 500 MG/50ML IV EMUL
INTRAVENOUS | Status: AC
  Filled 2022-10-28: qty 100

## 2022-10-28 MED ORDER — PROPOFOL 10 MG/ML IV BOLUS
INTRAVENOUS | Status: DC | PRN
  Administered 2022-10-28 (×3): 20 mg via INTRAVENOUS

## 2022-10-28 MED ORDER — PROPOFOL 1000 MG/100ML IV EMUL
INTRAVENOUS | Status: AC
  Filled 2022-10-28: qty 100

## 2022-10-28 MED ORDER — LIDOCAINE 2% (20 MG/ML) 5 ML SYRINGE
INTRAMUSCULAR | Status: DC | PRN
  Administered 2022-10-28: 100 mg via INTRAVENOUS

## 2022-10-28 MED ORDER — LACTATED RINGERS IV SOLN
INTRAVENOUS | Status: AC | PRN
  Administered 2022-10-28: 1000 mL via INTRAVENOUS

## 2022-10-28 MED ORDER — POLYETHYLENE GLYCOL 3350 17 GM/SCOOP PO POWD
1.0000 | Freq: Once | ORAL | Status: AC
  Administered 2022-10-28: 255 g via ORAL
  Filled 2022-10-28: qty 255

## 2022-10-28 MED ORDER — PROPOFOL 10 MG/ML IV BOLUS
INTRAVENOUS | Status: AC
  Filled 2022-10-28: qty 20

## 2022-10-28 SURGICAL SUPPLY — 15 items

## 2022-10-28 NOTE — Op Note (Signed)
[facility] Patient Name: Vincent Baldwin Procedure Date: 10/28/2022 MRN: [id number] Attending MD: Carol Ada , MD, [id number] Date of Birth: [birth year] CSN: [id number] Age: 37 Admit Type: Inpatient Procedure:                Upper GI endoscopy Indications:              Iron deficiency anemia Providers:                Carol Ada, MD, Carlyn Reichert, RN, Fransico Setters                            Mbumina, Technician Referring MD:              Medicines:                Propofol per Anesthesia Complications:            No immediate complications. Estimated Blood Loss:     Estimated blood loss: none. Procedure:                Pre-Anesthesia Assessment:                           - Prior to the procedure, a History and Physical                            was performed, and patient medications and                            allergies were reviewed. The patient's tolerance of                            previous anesthesia was also reviewed. The risks                            and benefits of the procedure and the sedation                            options and risks were discussed with the patient.                            All questions were answered, and informed consent                            was obtained. Prior Anticoagulants: The patient has                            taken no anticoagulant or antiplatelet agents. ASA                            Grade Assessment: II - A patient with mild systemic                            disease. After reviewing the risks and benefits,  the patient was deemed in satisfactory condition to                            undergo the procedure.                           - Sedation was administered by an anesthesia                            professional. Deep sedation was attained.                           After obtaining informed consent, the endoscope was                            passed under direct vision.  Throughout the                            procedure, the patient's blood pressure, pulse, and                            oxygen saturations were monitored continuously. The                            [id number] Olympus endoscope was introduced                            through the mouth, and advanced to the second part                            of duodenum. The upper GI endoscopy was                            accomplished without difficulty. The patient                            tolerated the procedure well. Scope In: Scope Out: Findings:      The esophagus was normal.      The stomach was normal.      The examined duodenum was normal.      The original intent was to perform an EGD/colonoscopy, but he did not       want to continue drinking the prep after two cups as it made his       nauseous. Impression:               - Normal esophagus.                           - Normal stomach.                           - Normal examined duodenum.                           - No specimens collected. Moderate Sedation:      Not Applicable - Patient had care per  Anesthesia. Recommendation:           - Return patient to hospital ward for ongoing care.                           - Recommend a colonoscopy. Procedure Code(s):        --- Professional ---                           [phone], Esophagogastroduodenoscopy, flexible,                            transoral; diagnostic, including collection of                            specimen(s) by brushing or washing, when performed                            (separate procedure) Diagnosis Code(s):        --- Professional ---                           D50.9, Iron deficiency anemia, unspecified CPT copyright 2022 American Medical Association. All rights reserved. The codes documented in this report are preliminary and upon coder review may  be revised to meet current compliance requirements. Carol Ada, MD Carol Ada, MD 10/28/2022 1:32:04  PM This report has been signed electronically. Number of Addenda: 0

## 2022-10-28 NOTE — Anesthesia Postprocedure Evaluation (Signed)
Anesthesia Post Note  Patient: Vincent Baldwin  Procedure(s) Performed: ESOPHAGOGASTRODUODENOSCOPY (EGD) WITH PROPOFOL     Patient location during evaluation: PACU Anesthesia Type: MAC Level of consciousness: awake and alert Pain management: pain level controlled Vital Signs Assessment: post-procedure vital signs reviewed and stable Respiratory status: spontaneous breathing, nonlabored ventilation, respiratory function stable and patient connected to nasal cannula oxygen Cardiovascular status: stable and blood pressure returned to baseline Postop Assessment: no apparent nausea or vomiting Anesthetic complications: no   No notable events documented.  Last Vitals:  Vitals:   10/28/22 1420 10/28/22 1439  BP:  126/71  Pulse: 60 (!) 52  Resp: 13 16  Temp:  36.8 C  SpO2: 100% 100%    Last Pain:  Vitals:   10/28/22 1439  TempSrc: Oral  PainSc:                  March Rummage Arsal Tappan

## 2022-10-28 NOTE — Transfer of Care (Signed)
Immediate Anesthesia Transfer of Care Note  Patient: Vincent Baldwin  Procedure(s) Performed: ESOPHAGOGASTRODUODENOSCOPY (EGD) WITH PROPOFOL  Patient Location: PACU and Endoscopy Unit  Anesthesia Type:MAC  Level of Consciousness: awake and patient cooperative  Airway & Oxygen Therapy: Patient Spontanous Breathing and Patient connected to nasal cannula oxygen  Post-op Assessment: Report given to RN and Post -op Vital signs reviewed and stable  Post vital signs: Reviewed and stable  Last Vitals:  Vitals Value Taken Time  BP    Temp    Pulse 66 10/28/22 1333  Resp 16 10/28/22 1333  SpO2 100 % 10/28/22 1333  Vitals shown include unvalidated device data.  Last Pain:  Vitals:   10/28/22 1221  TempSrc: Temporal  PainSc: 0-No pain         Complications: No notable events documented.

## 2022-10-28 NOTE — Anesthesia Procedure Notes (Addendum)
Procedure Name: MAC Date/Time: 10/28/2022 1:18 PM  Performed by: West Pugh, CRNAPre-anesthesia Checklist: Patient identified, Emergency Drugs available, Suction available, Patient being monitored and Timeout performed Patient Re-evaluated:Patient Re-evaluated prior to induction Oxygen Delivery Method: Simple face mask Preoxygenation: Pre-oxygenation with 100% oxygen Placement Confirmation: positive ETCO2 Dental Injury: Teeth and Oropharynx as per pre-operative assessment

## 2022-10-28 NOTE — TOC CM/SW Note (Signed)
Transition of Care [facility]) Screening Note  Patient Details  Name: Vincent Baldwin Date of Birth: [birth year]  Transition of Care [facility]) CM/SW Contact:    Sherie Don, LCSW Phone Number: 10/28/2022, 8:43 AM  Transition of Care Department [facility]) has reviewed patient and no TOC needs have been identified at this time. We will continue to monitor patient advancement through interdisciplinary progression rounds. If new patient transition needs arise, please place a TOC consult.

## 2022-10-28 NOTE — Anesthesia Preprocedure Evaluation (Signed)
Anesthesia Evaluation  Patient identified by MRN, date of birth, ID band Patient awake    Reviewed: Allergy & Precautions, NPO status , Patient's Chart, lab work & pertinent test results  Airway Mallampati: II  TM Distance: >3 FB Neck ROM: Full    Dental no notable dental hx.    Pulmonary neg pulmonary ROS, former smoker   Pulmonary exam normal        Cardiovascular negative cardio ROS  Rhythm:Regular Rate:Normal     Neuro/Psych negative neurological ROS  negative psych ROS   GI/Hepatic Neg liver ROS,,,GIB   Endo/Other  negative endocrine ROS    Renal/GU negative Renal ROS  negative genitourinary   Musculoskeletal negative musculoskeletal ROS (+)    Abdominal Normal abdominal exam  (+)   Peds  Hematology  (+) Blood dyscrasia, anemia   Anesthesia Other Findings   Reproductive/Obstetrics                             Anesthesia Physical Anesthesia Plan  ASA: 2  Anesthesia Plan: MAC   Post-op Pain Management:    Induction: Intravenous  PONV Risk Score and Plan: 1 and Propofol infusion and Treatment may vary due to age or medical condition  Airway Management Planned: Simple Face Mask and Nasal Cannula  Additional Equipment: None  Intra-op Plan:   Post-operative Plan:   Informed Consent: I have reviewed the patients History and Physical, chart, labs and discussed the procedure including the risks, benefits and alternatives for the proposed anesthesia with the patient or authorized representative who has indicated his/her understanding and acceptance.     Dental advisory given  Plan Discussed with: CRNA  Anesthesia Plan Comments:        Anesthesia Quick Evaluation

## 2022-10-28 NOTE — Progress Notes (Signed)
  Progress Note   Patient: Vincent Baldwin [id number] DOB: Nov 17, 1985 DOA: 10/26/2022     1 DOS: the patient was seen and examined on 10/28/2022   Brief hospital course: 37 year old man PMH rectal bleeding, no previous GI workup, presented with headache, dizziness.  Found to have significant anemia, admitted for GI bleed.  Assessment and Plan: GIB acute/chronic with ABLA/subacute anemia, symptomatic History of hemorrhoids and chronic bleed with acute worsening.  Significant anemia noted, symptomatic on admission.  Imaging of the abdomen pelvis unremarkable. Transfused 2 units PRBC 3/27 with appropriate rise, trend hemoglobin Remain NPO.  EGD unremarkable. colonoscopy pending   Left groin pain History of inguinal hernia repair. CT scan done, no abnormalities noted.  Can follow-up as an outpatient.      Subjective:  Feels fine Still bleeding, showed picture of blood in toilet, he also described clots No pain  Physical Exam: Vitals:   10/28/22 0130 10/28/22 0513 10/28/22 0916 10/28/22 1221  BP: 118/62 (!) 104/56 106/69 133/68  Pulse: (!) 52 (!) 52 (!) 54   Resp: 18 18 17 20   Temp: 98.6 F (37 C) 98.8 F (37.1 C) 98.1 F (36.7 C) (!) 97.2 F (36.2 C)  TempSrc: Oral Oral Oral Temporal  SpO2: 100% 100% 100% 100%  Weight:      Height:       Physical Exam Vitals reviewed.  Constitutional:      General: He is not in acute distress.    Appearance: He is not ill-appearing or toxic-appearing.  Cardiovascular:     Rate and Rhythm: Normal rate and regular rhythm.     Heart sounds: No murmur heard. Pulmonary:     Effort: Pulmonary effort is normal. No respiratory distress.     Breath sounds: No wheezing, rhonchi or rales.  Abdominal:     General: There is no distension.     Palpations: Abdomen is soft.  Neurological:     Mental Status: He is alert.  Psychiatric:        Mood and Affect: Mood normal.        Behavior: Behavior normal.     Data Reviewed: Hgb up to 9.9 >  9.0 after 2 units PRBC  Family Communication: none  Disposition: Status is: Inpatient Remains inpatient appropriate because: anemia  Planned Discharge Destination: Home    Time spent: 20 minutes  Author: Murray Hodgkins, MD 10/28/2022 1:30 PM  For on call review [email or web address].

## 2022-10-29 ENCOUNTER — Encounter (HOSPITAL_COMMUNITY): Payer: Self-pay | Admitting: Family Medicine

## 2022-10-29 ENCOUNTER — Inpatient Hospital Stay (HOSPITAL_COMMUNITY): Payer: Medicaid Other | Admitting: Certified Registered Nurse Anesthetist

## 2022-10-29 ENCOUNTER — Encounter (HOSPITAL_COMMUNITY)
Admission: EM | Disposition: A | Discharge status: Home / Self Care | Payer: Self-pay | Source: Home / Self Care | Attending: Family Medicine

## 2022-10-29 DIAGNOSIS — K922 Gastrointestinal hemorrhage, unspecified: Secondary | ICD-10-CM | POA: Diagnosis not present

## 2022-10-29 DIAGNOSIS — D509 Iron deficiency anemia, unspecified: Secondary | ICD-10-CM

## 2022-10-29 DIAGNOSIS — K5730 Diverticulosis of large intestine without perforation or abscess without bleeding: Secondary | ICD-10-CM

## 2022-10-29 DIAGNOSIS — Z87891 Personal history of nicotine dependence: Secondary | ICD-10-CM | POA: Diagnosis not present

## 2022-10-29 DIAGNOSIS — K640 First degree hemorrhoids: Secondary | ICD-10-CM

## 2022-10-29 DIAGNOSIS — D62 Acute posthemorrhagic anemia: Secondary | ICD-10-CM | POA: Diagnosis not present

## 2022-10-29 HISTORY — PX: COLONOSCOPY WITH PROPOFOL: SHX5780

## 2022-10-29 HISTORY — PX: BIOPSY: SHX5522

## 2022-10-29 LAB — CBC
HCT: 29.4 % — ABNORMAL LOW (ref 39.0–52.0)
Hemoglobin: 9.5 g/dL — ABNORMAL LOW (ref 13.0–17.0)
MCH: 27.6 pg (ref 26.0–34.0)
MCHC: 32.3 g/dL (ref 30.0–36.0)
MCV: 85.5 fL (ref 80.0–100.0)
Platelets: 272 10*3/uL (ref 150–400)
RBC: 3.44 MIL/uL — ABNORMAL LOW (ref 4.22–5.81)
RDW: 14.3 % (ref 11.5–15.5)
WBC: 7.3 10*3/uL (ref 4.0–10.5)
nRBC: 0 % (ref 0.0–0.2)

## 2022-10-29 SURGERY — COLONOSCOPY WITH PROPOFOL
Anesthesia: Monitor Anesthesia Care

## 2022-10-29 MED ORDER — LIDOCAINE 2% (20 MG/ML) 5 ML SYRINGE
INTRAMUSCULAR | Status: DC | PRN
  Administered 2022-10-29: 100 mg via INTRAVENOUS

## 2022-10-29 MED ORDER — PROPOFOL 10 MG/ML IV BOLUS
INTRAVENOUS | Status: AC
  Filled 2022-10-29: qty 20

## 2022-10-29 MED ORDER — PROPOFOL 500 MG/50ML IV EMUL
INTRAVENOUS | Status: DC | PRN
  Administered 2022-10-29: 125 ug/kg/min via INTRAVENOUS

## 2022-10-29 MED ORDER — LACTATED RINGERS IV SOLN
INTRAVENOUS | Status: AC | PRN
  Administered 2022-10-29: 1000 mL via INTRAVENOUS

## 2022-10-29 MED ORDER — PROPOFOL 10 MG/ML IV BOLUS
INTRAVENOUS | Status: DC | PRN
  Administered 2022-10-29: 10 mg via INTRAVENOUS
  Administered 2022-10-29: 20 mg via INTRAVENOUS
  Administered 2022-10-29: 30 mg via INTRAVENOUS
  Administered 2022-10-29: 40 mg via INTRAVENOUS

## 2022-10-29 MED ORDER — PHENYLEPHRINE 80 MCG/ML (10ML) SYRINGE FOR IV PUSH (FOR BLOOD PRESSURE SUPPORT)
PREFILLED_SYRINGE | INTRAVENOUS | Status: DC | PRN
  Administered 2022-10-29: 80 ug via INTRAVENOUS
  Administered 2022-10-29 (×2): 40 ug via INTRAVENOUS

## 2022-10-29 MED ORDER — PROPOFOL 1000 MG/100ML IV EMUL
INTRAVENOUS | Status: AC
  Filled 2022-10-29: qty 100

## 2022-10-29 SURGICAL SUPPLY — 22 items
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
## 2022-10-29 NOTE — Op Note (Signed)
[facility] Patient Name: Vincent Baldwin Procedure Date: 10/29/2022 MRN: [id number] Attending MD: Danton Clap DO, DO, [id number] Date of Birth: Jan 29, 1986 CSN: [id number] Age: 37 Admit Type: Inpatient Procedure:                Colonoscopy Indications:              Hematochezia, Iron deficiency anemia Providers:                Danton Clap DO, DO, Dulcy Fanny, Benetta Spar, Technician Referring MD:              Medicines:                See the Anesthesia note for documentation of the                            administered medications Complications:            No immediate complications. Estimated Blood Loss:     Estimated blood loss: none. Estimated blood loss                            was minimal. Procedure:                Pre-Anesthesia Assessment:                           - ASA Grade Assessment: II - A patient with mild                            systemic disease.                           - The risks and benefits of the procedure and the                            sedation options and risks were discussed with the                            patient. All questions were answered and informed                            consent was obtained.                           After obtaining informed consent, the colonoscope                            was passed under direct vision. Throughout the                            procedure, the patient's blood pressure, pulse, and                            oxygen saturations were monitored continuously. The  [id number]) Olympus colonoscope was                            introduced through the anus and advanced to the the                            terminal ileum. The colonoscopy was performed                            without difficulty. The patient tolerated the                            procedure well. The quality of the bowel                             preparation was evaluated using the BBPS [facility]                            Bowel Preparation Scale) with scores of: Right                            Colon = 3 (entire mucosa seen well with no residual                            staining, small fragments of stool or opaque                            liquid), Transverse Colon = 3 (entire mucosa seen                            well with no residual staining, small fragments of                            stool or opaque liquid) and Left Colon = 3 (entire                            mucosa seen well with no residual staining, small                            fragments of stool or opaque liquid). The total                            BBPS score equals 9. The quality of the bowel                            preparation was excellent. Scope In: 9:01:03 AM Scope Out: 9:19:54 AM Scope Withdrawal Time: 0 hours 14 minutes 42 seconds  Total Procedure Duration: 0 hours 18 minutes 51 seconds  Findings:      The digital rectal exam findings include internal hemorrhoids (Grade I).       Pertinent negatives include normal sphincter tone.      A few medium-mouthed diverticula were found in the distal ascending  colon. There was no evidence of diverticular bleeding.      The terminal ileum appeared normal. Biopsies were taken with a cold       forceps for histology. Impression:               - Internal hemorrhoids (Grade I) found on digital                            rectal exam.                           - Diverticulosis in the distal ascending colon.                            There was no evidence of diverticular bleeding.                           - The examined portion of the ileum was normal.                            Biopsied. Moderate Sedation:      See the other procedure note for documentation of moderate sedation with       intraservice time. Recommendation:           - Suspect hematochezia and anemia from right sided                             diverticular disease (also contributed to by                            internal hemorrhoids).                           - Return patient to hospital ward for ongoing care.                           - Resume regular diet.                           - Continue present medications.                           - Await pathology results.                           - Return to GI office (Dr. Carol Ada) in 1 month.                           - If Hgb remains low or rectal bleeding were to                            persist, to visualize the small bowel, consider                            performing video capsule endoscopy.                           -  Check hemoglobin in 3 days as an outpatient. Procedure Code(s):        --- Professional ---                           [phone], Colonoscopy, flexible; with biopsy, single                            or multiple Diagnosis Code(s):        --- Professional ---                           K64.0, First degree hemorrhoids                           K92.1, Melena (includes Hematochezia)                           D50.9, Iron deficiency anemia, unspecified                           K57.30, Diverticulosis of large intestine without                            perforation or abscess without bleeding CPT copyright 2022 American Medical Association. All rights reserved. The codes documented in this report are preliminary and upon coder review may  be revised to meet current compliance requirements. Dr Danton Clap, [place], DO 10/29/2022 9:37:16 AM Number of Addenda: 0

## 2022-10-29 NOTE — Interval H&P Note (Signed)
History and Physical Interval Note:  10/29/2022 8:51 AM  [name]  has presented today for surgery, with the diagnosis of Hematochezia, anemia.  The various methods of treatment have been discussed with the patient and family. After consideration of risks, benefits and other options for treatment, the patient has consented to  Procedure(s): COLONOSCOPY WITH PROPOFOL (N/A) as a surgical intervention.  The patient's history has been reviewed, patient examined, no change in status, stable for surgery.  I have reviewed the patient's chart and labs.  Questions were answered to the patient's satisfaction.     Vincent Baldwin

## 2022-10-29 NOTE — Anesthesia Preprocedure Evaluation (Addendum)
Anesthesia Evaluation  Patient identified by MRN, date of birth, ID band Patient awake    Reviewed: Allergy & Precautions, NPO status , Patient's Chart, lab work & pertinent test results  Airway Mallampati: I       Dental no notable dental hx. (+) Dental Advisory Given, Teeth Intact   Pulmonary former smoker   Pulmonary exam normal breath sounds clear to auscultation       Cardiovascular negative cardio ROS Normal cardiovascular exam Rhythm:Regular Rate:Normal     Neuro/Psych negative neurological ROS  negative psych ROS   GI/Hepatic Neg liver ROS,,,Hematochezia   Endo/Other  negative endocrine ROS    Renal/GU negative Renal ROS  negative genitourinary   Musculoskeletal negative musculoskeletal ROS (+)    Abdominal   Peds  Hematology  (+) Blood dyscrasia, anemia   Anesthesia Other Findings   Reproductive/Obstetrics                             Anesthesia Physical Anesthesia Plan  ASA: 2  Anesthesia Plan: MAC   Post-op Pain Management: Minimal or no pain anticipated   Induction: Intravenous  PONV Risk Score and Plan: 1 and Treatment may vary due to age or medical condition and Propofol infusion  Airway Management Planned: Natural Airway and Simple Face Mask  Additional Equipment: None  Intra-op Plan:   Post-operative Plan:   Informed Consent: I have reviewed the patients History and Physical, chart, labs and discussed the procedure including the risks, benefits and alternatives for the proposed anesthesia with the patient or authorized representative who has indicated his/her understanding and acceptance.     Dental advisory given  Plan Discussed with: CRNA and Anesthesiologist  Anesthesia Plan Comments:        Anesthesia Quick Evaluation

## 2022-10-29 NOTE — Plan of Care (Signed)
  Problem: Education: Goal: Knowledge of General Education information will improve Description: Including pain rating scale, medication(s)/side effects and non-pharmacologic comfort measures 10/29/2022 1008 by Lorin Picket, RN Outcome: Adequate for Discharge 10/29/2022 1007 by Lorin Picket, RN Outcome: Adequate for Discharge   Problem: Health Behavior/Discharge Planning: Goal: Ability to manage health-related needs will improve 10/29/2022 1008 by Lorin Picket, RN Outcome: Adequate for Discharge 10/29/2022 1007 by Lorin Picket, RN Outcome: Adequate for Discharge   Problem: Clinical Measurements: Goal: Ability to maintain clinical measurements within normal limits will improve 10/29/2022 1008 by Lorin Picket, RN Outcome: Adequate for Discharge 10/29/2022 1007 by Lorin Picket, RN Outcome: Adequate for Discharge Goal: Will remain free from infection 10/29/2022 1008 by Lorin Picket, RN Outcome: Adequate for Discharge 10/29/2022 1007 by Lorin Picket, RN Outcome: Adequate for Discharge Goal: Diagnostic test results will improve 10/29/2022 1008 by Lorin Picket, RN Outcome: Adequate for Discharge 10/29/2022 1007 by Lorin Picket, RN Outcome: Adequate for Discharge Goal: Respiratory complications will improve 10/29/2022 1008 by Lorin Picket, RN Outcome: Adequate for Discharge 10/29/2022 1007 by Lorin Picket, RN Outcome: Adequate for Discharge Goal: Cardiovascular complication will be avoided 10/29/2022 1008 by Lorin Picket, RN Outcome: Adequate for Discharge 10/29/2022 1007 by Lorin Picket, RN Outcome: Adequate for Discharge   Problem: Activity: Goal: Risk for activity intolerance will decrease 10/29/2022 1008 by Lorin Picket, RN Outcome: Adequate for Discharge 10/29/2022 1007 by Lorin Picket, RN Outcome: Adequate for Discharge   Problem: Nutrition: Goal: Adequate nutrition will be maintained 10/29/2022 1008 by Lorin Picket,  RN Outcome: Adequate for Discharge 10/29/2022 1007 by Lorin Picket, RN Outcome: Adequate for Discharge   Problem: Coping: Goal: Level of anxiety will decrease 10/29/2022 1008 by Lorin Picket, RN Outcome: Adequate for Discharge 10/29/2022 1007 by Lorin Picket, RN Outcome: Adequate for Discharge   Problem: Elimination: Goal: Will not experience complications related to bowel motility 10/29/2022 1008 by Lorin Picket, RN Outcome: Adequate for Discharge 10/29/2022 1007 by Lorin Picket, RN Outcome: Adequate for Discharge Goal: Will not experience complications related to urinary retention 10/29/2022 1008 by Lorin Picket, RN Outcome: Adequate for Discharge 10/29/2022 1007 by Lorin Picket, RN Outcome: Adequate for Discharge   Problem: Pain Managment: Goal: General experience of comfort will improve 10/29/2022 1008 by Lorin Picket, RN Outcome: Adequate for Discharge 10/29/2022 1007 by Lorin Picket, RN Outcome: Adequate for Discharge   Problem: Safety: Goal: Ability to remain free from injury will improve 10/29/2022 1008 by Lorin Picket, RN Outcome: Adequate for Discharge 10/29/2022 1007 by Lorin Picket, RN Outcome: Adequate for Discharge   Problem: Skin Integrity: Goal: Risk for impaired skin integrity will decrease 10/29/2022 1008 by Lorin Picket, RN Outcome: Adequate for Discharge 10/29/2022 1007 by Lorin Picket, RN Outcome: Adequate for Discharge

## 2022-10-29 NOTE — Brief Op Note (Signed)
10/29/2022  9:23 AM  PATIENT:  Vincent Baldwin  37 y.o. male  PRE-OPERATIVE DIAGNOSIS:  Hematochezia  POST-OPERATIVE DIAGNOSIS: diverticulosis right colon, normal appearing terminal ileum status post biopsies to rule out IBD, grade I internal hemorrhoids  PROCEDURE:  Procedure(s): COLONOSCOPY WITH PROPOFOL (N/A) BIOPSY  SURGEON:  Surgeon(s) and Role:    Loney Laurence, DO - Primary  Recommendations: -No obvious source of GI blood loss seen on today's exam, no blood noted throughout procedure, no blood noted in small bowel, bowel mucosa does appear somewhat sensitive (to scope suction) -Suspect hematochezia from diverticular source or internal hemorrhoids (though internal hemorrhoids not primary source as would not likely cause this profound anemia) -Continue trend H/H, transfuse for Hgb < 7 -Follow up biopsy results -Pending Hgb trend, could consider outpatient video capsule endoscopy to investigate anemia and hematochezia further, recommend follow up in outpatient setting with Dr. Carol Ada -Ok for diet from [place] stable for discharge from [place]

## 2022-10-29 NOTE — Progress Notes (Signed)
Discussed AVS with patient verbalizing understanding. Pt stripped entire bed, placing linen in blue bag, trowing all garbage in trash can and gathered all personal belongings. Pt discharged ambulatory to his vehicle with all bedside belongings.

## 2022-10-29 NOTE — Transfer of Care (Signed)
Immediate Anesthesia Transfer of Care Note  Patient: Vincent Baldwin  Procedure(s) Performed: COLONOSCOPY WITH PROPOFOL BIOPSY  Patient Location: Endoscopy Unit  Anesthesia Type:MAC  Level of Consciousness: drowsy and patient cooperative  Airway & Oxygen Therapy: Patient Spontanous Breathing and Patient connected to face mask oxygen  Post-op Assessment: Report given to RN and Post -op Vital signs reviewed and stable  Post vital signs: Reviewed and stable  Last Vitals:  Vitals Value Taken Time  BP 103/70 10/29/22 0929  Temp 36.7 C 10/29/22 0924  Pulse 73 10/29/22 0930  Resp 19 10/29/22 0930  SpO2 100 % 10/29/22 0930  Vitals shown include unvalidated device data.  Last Pain:  Vitals:   10/29/22 0924  TempSrc: Temporal  PainSc:          Complications: No notable events documented.

## 2022-10-29 NOTE — Plan of Care (Signed)

## 2022-10-29 NOTE — Anesthesia Postprocedure Evaluation (Signed)
Anesthesia Post Note  Patient: Vincent Baldwin  Procedure(s) Performed: COLONOSCOPY WITH PROPOFOL BIOPSY     Patient location during evaluation: PACU Anesthesia Type: MAC Level of consciousness: awake and alert and oriented Pain management: pain level controlled Vital Signs Assessment: post-procedure vital signs reviewed and stable Respiratory status: spontaneous breathing, nonlabored ventilation and respiratory function stable Cardiovascular status: stable and blood pressure returned to baseline Postop Assessment: no apparent nausea or vomiting Anesthetic complications: no   No notable events documented.  Last Vitals:  Vitals:   10/29/22 0929 10/29/22 0930  BP: 103/70   Pulse: 62 78  Resp: 20 20  Temp:    SpO2: 100% 100%    Last Pain:  Vitals:   10/29/22 0924  TempSrc: Temporal  PainSc:                  Rosaline Ezekiel A.

## 2022-11-01 ENCOUNTER — Encounter (HOSPITAL_COMMUNITY): Payer: Self-pay | Admitting: Gastroenterology

## 2022-11-01 LAB — SURGICAL PATHOLOGY

## 2023-03-08 ENCOUNTER — Ambulatory Visit (HOSPITAL_COMMUNITY)
Admission: EM | Admit: 2023-03-08 | Discharge: 2023-03-08 | Disposition: A | Discharge status: Home / Self Care | Payer: Medicaid Other | Attending: Family Medicine | Admitting: Family Medicine

## 2023-03-08 ENCOUNTER — Encounter (HOSPITAL_COMMUNITY): Payer: Self-pay | Admitting: *Deleted

## 2023-03-08 ENCOUNTER — Other Ambulatory Visit: Payer: Self-pay

## 2023-03-08 DIAGNOSIS — Z711 Person with feared health complaint in whom no diagnosis is made: Secondary | ICD-10-CM

## 2023-03-08 DIAGNOSIS — Z113 Encounter for screening for infections with a predominantly sexual mode of transmission: Secondary | ICD-10-CM | POA: Diagnosis present

## 2023-03-08 DIAGNOSIS — K0889 Other specified disorders of teeth and supporting structures: Secondary | ICD-10-CM | POA: Diagnosis not present

## 2023-03-08 LAB — HIV ANTIBODY (ROUTINE TESTING W REFLEX): HIV Screen 4th Generation wRfx: NONREACTIVE

## 2023-03-08 MED ORDER — LIDOCAINE HCL (PF) 1 % IJ SOLN
INTRAMUSCULAR | Status: AC
  Filled 2023-03-08: qty 2

## 2023-03-08 MED ORDER — CEFTRIAXONE SODIUM 500 MG IJ SOLR
500.0000 mg | INTRAMUSCULAR | Status: DC
  Administered 2023-03-08: 500 mg via INTRAMUSCULAR

## 2023-03-08 MED ORDER — CEFTRIAXONE SODIUM 500 MG IJ SOLR
INTRAMUSCULAR | Status: AC
  Filled 2023-03-08: qty 500

## 2023-03-08 MED ORDER — DOXYCYCLINE HYCLATE 100 MG PO CAPS: 100.0000 mg | ORAL_CAPSULE | Freq: Two times a day (BID) | ORAL | 0 refills | Status: AC

## 2023-03-08 NOTE — Discharge Instructions (Addendum)

## 2023-03-08 NOTE — ED Triage Notes (Signed)
Pt reports Rt sided facial swelling and wants to be tested for STD
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
## 2024-06-12 ENCOUNTER — Encounter: Admitting: Family

## 2024-06-12 ENCOUNTER — Ambulatory Visit: Admitting: Adult Health

## 2024-06-12 ENCOUNTER — Ambulatory Visit
Admission: RE | Admit: 2024-06-12 | Discharge: 2024-06-12 | Disposition: A | Discharge status: Home / Self Care | Source: Ambulatory Visit | Attending: Adult Health | Admitting: Adult Health

## 2024-06-12 ENCOUNTER — Encounter: Payer: Self-pay | Admitting: Adult Health

## 2024-06-12 VITALS — BP 124/72 | HR 79 | Temp 97.2°F | Resp 18 | Ht 67.0 in | Wt 177.4 lb

## 2024-06-12 DIAGNOSIS — Z113 Encounter for screening for infections with a predominantly sexual mode of transmission: Secondary | ICD-10-CM | POA: Diagnosis not present

## 2024-06-12 DIAGNOSIS — R0789 Other chest pain: Secondary | ICD-10-CM | POA: Diagnosis not present

## 2024-06-12 DIAGNOSIS — Z7689 Persons encountering health services in other specified circumstances: Secondary | ICD-10-CM

## 2024-06-12 DIAGNOSIS — F1290 Cannabis use, unspecified, uncomplicated: Secondary | ICD-10-CM

## 2024-06-12 DIAGNOSIS — R1032 Left lower quadrant pain: Secondary | ICD-10-CM

## 2024-06-12 NOTE — Progress Notes (Signed)
   This encounter was created in error - please disregard. No show
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
## 2024-06-13 ENCOUNTER — Ambulatory Visit: Payer: Self-pay | Admitting: Adult Health

## 2024-06-13 LAB — CBC WITH DIFFERENTIAL/PLATELET
Absolute Lymphocytes: 1934 {cells}/uL (ref 850–3900)
Absolute Monocytes: 351 {cells}/uL (ref 200–950)
Basophils Absolute: 31 {cells}/uL (ref 0–200)
Basophils Relative: 0.8 %
Eosinophils Absolute: 109 {cells}/uL (ref 15–500)
Eosinophils Relative: 2.8 %
HCT: 47.1 % (ref 38.5–50.0)
Hemoglobin: 15.8 g/dL (ref 13.2–17.1)
MCH: 31.3 pg (ref 27.0–33.0)
MCHC: 33.5 g/dL (ref 32.0–36.0)
MCV: 93.3 fL (ref 80.0–100.0)
MPV: 10.5 fL (ref 7.5–12.5)
Monocytes Relative: 9 %
Neutro Abs: 1474 {cells}/uL — ABNORMAL LOW (ref 1500–7800)
Neutrophils Relative %: 37.8 %
Platelets: 265 Thousand/uL (ref 140–400)
RBC: 5.05 Million/uL (ref 4.20–5.80)
RDW: 12.8 % (ref 11.0–15.0)
Total Lymphocyte: 49.6 %
WBC: 3.9 Thousand/uL (ref 3.8–10.8)

## 2024-06-13 LAB — COMPREHENSIVE METABOLIC PANEL WITH GFR
AG Ratio: 1.6 (calc) (ref 1.0–2.5)
ALT: 57 U/L — ABNORMAL HIGH (ref 9–46)
AST: 51 U/L — ABNORMAL HIGH (ref 10–40)
Albumin: 4.4 g/dL (ref 3.6–5.1)
Alkaline phosphatase (APISO): 117 U/L (ref 36–130)
BUN: 8 mg/dL (ref 7–25)
CO2: 26 mmol/L (ref 20–32)
Calcium: 9.5 mg/dL (ref 8.6–10.3)
Chloride: 108 mmol/L (ref 98–110)
Creat: 0.89 mg/dL (ref 0.60–1.26)
Globulin: 2.8 g/dL (ref 1.9–3.7)
Glucose, Bld: 100 mg/dL — ABNORMAL HIGH (ref 65–99)
Potassium: 4.4 mmol/L (ref 3.5–5.3)
Sodium: 142 mmol/L (ref 135–146)
Total Bilirubin: 0.5 mg/dL (ref 0.2–1.2)
Total Protein: 7.2 g/dL (ref 6.1–8.1)
eGFR: 114 mL/min/1.73m2 (ref >=60)

## 2024-06-13 LAB — LIPID PANEL
Cholesterol: 182 mg/dL (ref <200)
HDL: 47 mg/dL (ref >=40)
LDL Cholesterol (Calc): 112 mg/dL — ABNORMAL HIGH
Non-HDL Cholesterol (Calc): 135 mg/dL — ABNORMAL HIGH (ref <130)
Total CHOL/HDL Ratio: 3.9 (calc) (ref <5.0)
Triglycerides: 124 mg/dL (ref <150)

## 2024-06-13 LAB — HEPATITIS C ANTIBODY: Hepatitis C Ab: NONREACTIVE

## 2024-06-13 NOTE — Progress Notes (Signed)
chest xray normal

## 2024-06-13 NOTE — Progress Notes (Signed)
-     LDL elevated -   Cholesterol normal, recommending to exercise at least 150 minutes/week and low-fat diet -   No anemia -   Electrolytes and kidney function normal - AST and ALT (liver enzymes) slightly elevated, will recheck on follow-up visit.

## 2024-06-26 ENCOUNTER — Encounter: Admitting: Adult Health

## 2024-06-26 NOTE — Progress Notes (Signed)
 This encounter was created in error - please disregard.
